# Patient Record
Sex: Female | Born: 1965
Health system: Southern US, Community
[De-identification: ages and names within clinical notes are randomized; demographics above are authoritative.]

## PROBLEM LIST (undated history)

## (undated) DIAGNOSIS — J302 Other seasonal allergic rhinitis: Secondary | ICD-10-CM

## (undated) DIAGNOSIS — T7840XA Allergy, unspecified, initial encounter: Secondary | ICD-10-CM

## (undated) DIAGNOSIS — E785 Hyperlipidemia, unspecified: Secondary | ICD-10-CM

## (undated) DIAGNOSIS — R000 Tachycardia, unspecified: Secondary | ICD-10-CM

## (undated) DIAGNOSIS — M1990 Unspecified osteoarthritis, unspecified site: Secondary | ICD-10-CM

## (undated) DIAGNOSIS — F419 Anxiety disorder, unspecified: Secondary | ICD-10-CM

## (undated) HISTORY — DX: Hyperlipidemia, unspecified: E78.5

## (undated) HISTORY — DX: Other seasonal allergic rhinitis: J30.2

## (undated) HISTORY — DX: Tachycardia, unspecified: R00.0

## (undated) HISTORY — DX: Allergy, unspecified, initial encounter: T78.40XA

## (undated) HISTORY — DX: Anxiety disorder, unspecified: F41.9

## (undated) HISTORY — DX: Unspecified osteoarthritis, unspecified site: M19.90

## (undated) HISTORY — PX: SHOULDER SURGERY: SHX246

---

## 2000-03-06 ENCOUNTER — Other Ambulatory Visit: Admission: RE | Admit: 2000-03-06 | Discharge: 2000-03-06 | Payer: Self-pay

## 2000-03-06 ENCOUNTER — Other Ambulatory Visit: Admission: RE | Admit: 2000-03-06 | Discharge: 2000-03-06 | Payer: Self-pay | Admitting: Obstetrics and Gynecology

## 2000-11-03 ENCOUNTER — Ambulatory Visit (HOSPITAL_COMMUNITY): Admission: RE | Admit: 2000-11-03 | Discharge: 2000-11-03 | Payer: Self-pay | Admitting: Obstetrics and Gynecology

## 2000-11-03 ENCOUNTER — Encounter (INDEPENDENT_AMBULATORY_CARE_PROVIDER_SITE_OTHER): Payer: Self-pay

## 2001-08-10 ENCOUNTER — Encounter (INDEPENDENT_AMBULATORY_CARE_PROVIDER_SITE_OTHER): Payer: Self-pay

## 2001-08-10 ENCOUNTER — Ambulatory Visit (HOSPITAL_COMMUNITY): Admission: RE | Admit: 2001-08-10 | Discharge: 2001-08-10 | Payer: Self-pay

## 2002-02-28 ENCOUNTER — Other Ambulatory Visit: Admission: RE | Admit: 2002-02-28 | Discharge: 2002-02-28 | Payer: Self-pay | Admitting: Obstetrics and Gynecology

## 2003-10-09 ENCOUNTER — Emergency Department (HOSPITAL_COMMUNITY): Admission: EM | Admit: 2003-10-09 | Discharge: 2003-10-09 | Payer: Self-pay | Admitting: Emergency Medicine

## 2007-12-27 ENCOUNTER — Ambulatory Visit: Payer: Self-pay | Admitting: Gastroenterology

## 2007-12-27 DIAGNOSIS — K921 Melena: Secondary | ICD-10-CM | POA: Insufficient documentation

## 2007-12-27 DIAGNOSIS — K589 Irritable bowel syndrome without diarrhea: Secondary | ICD-10-CM | POA: Insufficient documentation

## 2007-12-27 DIAGNOSIS — R109 Unspecified abdominal pain: Secondary | ICD-10-CM | POA: Insufficient documentation

## 2007-12-28 LAB — CONVERTED CEMR LAB
ALT: 12 units/L (ref 0–35)
AST: 17 units/L (ref 0–37)
Alkaline Phosphatase: 53 units/L (ref 39–117)
CO2: 28 meq/L (ref 19–32)
Calcium: 9 mg/dL (ref 8.4–10.5)
Glucose, Bld: 95 mg/dL (ref 70–99)
Lymphocytes Relative: 33.1 % (ref 12.0–46.0)
Monocytes Relative: 6.1 % (ref 3.0–12.0)
Neutrophils Relative %: 58.6 % (ref 43.0–77.0)
Platelets: 283 10*3/uL (ref 150–400)
Potassium: 4.2 meq/L (ref 3.5–5.1)
RDW: 11.6 % (ref 11.5–14.6)
Sed Rate: 9 mm/hr (ref 0–22)
Sodium: 139 meq/L (ref 135–145)
TSH: 2.1 microintl units/mL (ref 0.35–5.50)

## 2008-01-16 ENCOUNTER — Ambulatory Visit: Payer: Self-pay | Admitting: Gastroenterology

## 2008-01-16 ENCOUNTER — Encounter: Payer: Self-pay | Admitting: Gastroenterology

## 2008-01-20 ENCOUNTER — Encounter: Payer: Self-pay | Admitting: Gastroenterology

## 2008-05-19 ENCOUNTER — Ambulatory Visit: Payer: Self-pay | Admitting: Gastroenterology

## 2010-03-07 ENCOUNTER — Encounter: Payer: Self-pay | Admitting: General Practice

## 2010-07-02 NOTE — Op Note (Signed)
[facility]  Patient:    Cynthia Barron, Cynthia Barron [facility] Visit Number: [id number] MRN: [id number]          Service Type: DSU Location: [facility] Attending Physician:  Esmeralda Arthur Dictated by:   Silverio Lay, M.D. Proc. Date: 08/10/01 Admit Date:  08/10/2001 Discharge Date: 08/10/2001                             Operative Report  PREOPERATIVE DIAGNOSES:       Incomplete miscarriage.  POSTOPERATIVE DIAGNOSES:      Incomplete miscarriage.  ANESTHESIA:                   IV sedation and paracervical block.  PROCEDURE:                    Dilatation and evacuation.  SURGEON:                      Silverio Lay, M.D.  ESTIMATED BLOOD LOSS:         Minimal.  PROCEDURE:                    After being informed of the planned procedure with possible complications including bleeding, infection, uterine perforation, and retained products of conception, informed consent is obtained.  The patient is taken to OR #2 and given IV sedation, placed in the lithotomy position.  She was prepped and draped in a sterile fashion and her bladder was emptied with an in-and-out catheter.  GYN examination reveals an anteverted, anteflexed, slightly bulky uterus 6-7 weeks size with a flared cervix and two normal adnexa.  We insert a speculum, infiltrated anterior lip of the cervix with 1 cc of Nesacaine 1%, grasped the anterior lip of the cervix with tenaculum forceps, and sounded the uterus at 10 cm.  We then proceeded with paracervical block in the usual fashion using 10 cc of Nesacaine 1% and the cervix usually allowed a 37 gauge Hegar dilator.  We were then able to use a #10 curved cannula to evacuate what seems to be decidual remnants.  Very few, if any, products of conception.  After evacuation a sharp curette was used to assess the uterine walls which were free of any tissue. Instruments were removed.  Instruments and sponge count was complete x2. Estimated blood loss is  minimal.  Procedure is well tolerated by the patient who is taken to the recovery room in a well and stable condition. Dictated by:   Silverio Lay, M.D. Attending Physician:  Esmeralda Arthur DD:  08/10/01 TD:  08/12/01 Job: [id number]

## 2010-07-02 NOTE — Op Note (Signed)
[facility]  Patient:    Cynthia Barron, Cynthia Barron Visit Number: [id number] MRN: [id number]          Service Type: DSU Location: [facility] Attending Physician:  Leonard Schwartz Dictated by:   Janine Limbo, M.D. Proc. Date: 11/03/00 Admit Date:  11/03/2000                             Operative Report  PREOPERATIVE DIAGNOSES:       1. Pelvic pain.                               2. Ovarian cyst.  POSTOPERATIVE DIAGNOSES:      1. Pelvic pain.                               2. Ovarian cyst.                               3. Pelvic adhesions.                               4. A 1 cm fibroid uterus.  OPERATION:                    1. Diagnostic laparoscopy.                               2. Laparoscopic lysis of adhesions.                               3. Laparoscopic pelvic biopsies.                               4. Chromopertubation.  SURGEON:                      Janine Limbo, M.D.  ASSISTANT:  ANESTHESIA:                   General anesthesia.  ESTIMATED BLOOD LOSS:         20 cc.  INDICATIONS:                  Cynthia Barron is a 45 year old female, gravida 0, who presents with a history of pelvic pain.  An ultrasound was performed that showed a complex cyst on the ovary.  The patient understands the indications for her procedure and she accepts the risks of, but not limited to, anesthetic complications, bleeding, infections, and possible damage to surrounding organs.  FINDINGS:                     The uterus was normal size.  There was a 1 cm fibroid present on the left anterior fundus. The right fallopian tube appeared normal. The right ovary appeared normal except that there were filmy adhesions between the right fallopian tube and the right ovary and the right posterior cul-de-sac. The left fallopian tube was entangled in filmy adhesions.  The tube was adhered to the left ovary. The left ovary was  then adhered to the left pelvic  sidewall.  There were filmy adhesions in the posterior cul-de-sac.  The anterior cul-de-sac was free.  The large-bowel was adhered to the left pelvic sidewall. There were adhesions between the large-bowel and the right pelvic sidewall.  The appendix was adhered to the right ovary and to loops f large bowel.  The liver appeared normal except for perihepatic adhesions.  The left upper abdomen appeared normal.  There was no distinct evidence of endometriosis.  We did obtain biopsies from some of the adhesions to rule out the possibility of endometriosis.  The right fallopian tube quickly filled with dye with chromopertubation.  The dye quickly spilled from the right fallopian tube.  Initially, the left fallopian tube seemed to fill with dye but no dye was noted from the left fimbriated end.  We did instill 120 cc of blue dye and the left tube did eventually fill and dye did finally spill from the left fimbriated end. The fimbriated ends of both fallopian tubes appeared delicate at the end of our procedure.  There were a few filmy adhesions on the left ovary and these were removed as well.  DESCRIPTION OF PROCEDURE:     The patient was taken to the operating room where a general anesthetic was given.  The patients abdomen, perineum, and vagina were prepped with multiple layers of Betadine.  Examination under anesthesia was performed.  A Foley catheter was placed in the bladder.  An acorn cannula was placed inside the uterus.  The patient was sterilely draped. The subumbilical area was injected with 5 cc of 0.5% Marcaine.  An incision was made and the Verres needle was inserted without difficulty.  Proper placement was confirmed using the saline drop test.  A pneumoperitoneum was obtained.  The laparoscopic trocar was then substituted for the Verres needle. The laparoscope was inserted. The bowel was inspected and there was no evidence of trocar damage. Under direct visualization, two  suprapubic incisions were made after injecting the skin with 0.5% Marcaine.  Two 5 mm trocars were placed into the lower abdomen.  Pictures were taken of the patients  pelvic anatomy. We then began the process of lysing the adhesions. Using a combination of sharp and blunt dissection, the adhesions were lysed. Care was taken not to damage the bowel or other vital structures in the pelvis.  There was a small cyst present in the left ovary and the material from inside appeared to be purulent.  We were unable to remove the cyst wall from the ovary but we did obtain biopsies from this cavity for further identification.  The left pelvic sidewall was injected with normal saline where the adhesions were present to the left ovary.  A biopsy was obtained from this area.  Hemostasis was then obtained throughout the pelvis using the bipolar cautery.  The pelvis was vigorously irrigated. THe pelvis was again inspected and there was no evidence of damage to the vital structures. The bowel was viewed and there was no evidence of damage to the bowel.  The dye was then injected through the acorn cannula and the right tube quickly filled and dye quickly spilled from the right tube.  The left tube did not fill initially but with further installation of blue dye, the tube did finally open and dye did spill through the left tube. All the fluid was then irrigated and aspirated from the pelvis.  All instruments were removed.  The incisions were closed using deep and superficial  sutures of 4-0 Vicryl.  Sponge, needle and instrument counts were correct on two occasions. The estimated blood loss was 20 cc.  The patient tolerated the procedure well. She was taken to the recovery room in stable condition.  FOLLOW-UP INSTRUCTIONS:       The patient will return to see Dr. Stefano Gaul in two to three weeks for follow-up examination. She will return to work on November 06, 2000.  She will call for questions or concerns.  She was given a copy of the postoperative instruction sheet as prepared by the [facility] for patients who have undergone laparoscopy.  She was  given a prescription for Tylenol No. 3 one to two tablets every four hours as needed for pain.  She was also given doxycycline 100 mg twice a day for 10 days. Dictated by:   Janine Limbo, M.D. Attending Physician:  Leonard Schwartz DD:  11/03/00 TD:  11/03/00 Job: [phone] [id number]

## 2010-07-02 NOTE — H&P (Signed)
[facility]  Patient:    Cynthia Barron, MEINEKE Visit Number: [id number] MRN: [id number]          Service Type: Attending:  Janine Limbo, M.D. Dictated by:   Janine Limbo, M.D. Adm. Date:  11/03/00                           History and Physical  DATE OF BIRTH:                21-Sep-1965  HISTORY OF PRESENT ILLNESS:   [name] is a 45 year old female, gravida 0, who presents for diagnostic laparoscopy because of abdominal pain. The patient has had an ultrasound which showed a complex cyst in the left ovary. A repeat ultrasound again confirmed the presence of the complex left ovarian cyst. The patient had a chlamydia infection in January of 2002. She was treated and her test of cure was negative. The patient has tried to conceive for quite a long time and has not been successful. The patient denies a history of GYN surgery.  PAST MEDICAL HISTORY:         The patient denies hypertension and diabetes.  ALLERGIES:                    PENICILLIN.  SOCIAL HISTORY:               The patient denies cigarette use, alcohol use, and recreational drug use.  FAMILY HISTORY:               The patients great aunt has diabetes and hypertension.  REVIEW OF SYSTEMS:            The patient complains of occasional constipation, diarrhea and some rectal bleeding. She also has dyspareunia.  PHYSICAL EXAMINATION:  VITAL SIGNS:                  Weight is 135 pounds.  HEENT:                        Within normal limits.  CHEST:                        Clear.  HEART:                        Regular rate and rhythm.  BREASTS:                      Without masses.  BACK:                         Without CVA tenderness.  ABDOMEN:                      Tender in the left lower quadrant but there is no rebound.  EXTREMITIES:                  Within normal limits.  NEUROLOGIC:                   Grossly normal.  PELVIC:                       External  genitalia is normal. The vagina is normal. Cervix is nontender. Uterus is normal size, shape, and consistency.  Adnexa no masses.  ASSESSMENT:                   1. Pelvic pain.                               2. Complex left ovarian cyst.  PLAN:                         The patient will undergo a diagnostic laparoscopy. She understands the indications for her procedure and she accepts the risks of, but not limited to, anesthetic complications, bleeding, infections, and possible damage to the surrounding organs. Dictated by:   Janine Limbo, M.D. Attending:  Janine Limbo, M.D. DD:  10/31/00 TD:  10/31/00 Job: [phone] [id number]

## 2012-11-16 ENCOUNTER — Encounter: Payer: Self-pay | Admitting: Gastroenterology

## 2013-07-13 ENCOUNTER — Encounter: Payer: Self-pay | Admitting: Gastroenterology

## 2014-01-24 ENCOUNTER — Other Ambulatory Visit: Payer: Self-pay | Admitting: Obstetrics and Gynecology

## 2014-01-27 LAB — CYTOLOGY - PAP

## 2015-03-09 DIAGNOSIS — Z3040 Encounter for surveillance of contraceptives, unspecified: Secondary | ICD-10-CM | POA: Diagnosis not present

## 2015-03-09 DIAGNOSIS — Z01411 Encounter for gynecological examination (general) (routine) with abnormal findings: Secondary | ICD-10-CM | POA: Diagnosis not present

## 2015-03-09 DIAGNOSIS — Z01419 Encounter for gynecological examination (general) (routine) without abnormal findings: Secondary | ICD-10-CM | POA: Diagnosis not present

## 2015-03-09 DIAGNOSIS — Z1231 Encounter for screening mammogram for malignant neoplasm of breast: Secondary | ICD-10-CM | POA: Diagnosis not present

## 2015-03-09 DIAGNOSIS — D259 Leiomyoma of uterus, unspecified: Secondary | ICD-10-CM | POA: Diagnosis not present

## 2015-03-09 MED FILL — LARIN FE 1-20 TABLET: 1-20 | 84 days supply | Qty: 84 | Fill #0

## 2015-03-09 MED FILL — SERTRALINE HCL 50 MG TABLET: 50 | 30 days supply | Qty: 30 | Fill #1

## 2015-03-09 MED FILL — ZORVOLEX 35 MG CAPSULE: 35 | 30 days supply | Qty: 30 | Fill #1

## 2015-04-07 MED FILL — SERTRALINE HCL 50 MG TABLET: 50 | 30 days supply | Qty: 30 | Fill #2

## 2015-04-07 MED FILL — METOPROLOL SUCC ER 25 MG TA: 25 | 30 days supply | Qty: 30 | Fill #1

## 2015-04-08 MED FILL — MAGNESIUM OXIDE 400 MG TAB: 400 | 30 days supply | Qty: 30 | Fill #0

## 2015-05-08 MED FILL — SERTRALINE HCL 50 MG TABLET: 50 | 30 days supply | Qty: 30 | Fill #3

## 2015-05-18 MED FILL — METOPROLOL SUCC ER 25 MG TA: 25 | 30 days supply | Qty: 30 | Fill #2

## 2015-05-27 DIAGNOSIS — L918 Other hypertrophic disorders of the skin: Secondary | ICD-10-CM | POA: Diagnosis not present

## 2015-06-10 MED FILL — MAGNESIUM OXIDE 400 MG TAB: 400 | 30 days supply | Qty: 30 | Fill #1

## 2015-06-10 MED FILL — SERTRALINE HCL 50 MG TABLET: 50 | 30 days supply | Qty: 30 | Fill #4

## 2015-06-10 MED FILL — LARIN FE 1-20 TABLET: 1-20 | 84 days supply | Qty: 84 | Fill #1

## 2015-06-19 MED FILL — METOPROLOL SUCC ER 25 MG TA: 25 | 30 days supply | Qty: 30 | Fill #3

## 2015-07-14 MED FILL — SERTRALINE HCL 50 MG TABLET: 50 | 30 days supply | Qty: 30 | Fill #5

## 2015-07-21 MED FILL — METOPROLOL SUCC ER 25 MG TA: 25 | 30 days supply | Qty: 30 | Fill #4

## 2015-07-22 DIAGNOSIS — R002 Palpitations: Secondary | ICD-10-CM | POA: Diagnosis not present

## 2015-07-22 DIAGNOSIS — F419 Anxiety disorder, unspecified: Secondary | ICD-10-CM | POA: Diagnosis not present

## 2015-07-22 DIAGNOSIS — M62830 Muscle spasm of back: Secondary | ICD-10-CM | POA: Diagnosis not present

## 2015-07-24 MED FILL — AMRIX 15 MG CAPSULE ER: 15 | 30 days supply | Qty: 30 | Fill #0

## 2015-08-17 MED FILL — SERTRALINE HCL 50 MG TABLET: 50 | 30 days supply | Qty: 30 | Fill #0

## 2015-08-17 MED FILL — MAGNESIUM OXIDE 400 MG TAB: 400 | 30 days supply | Qty: 30 | Fill #2

## 2015-08-17 MED FILL — METOPROLOL SUCC ER 25 MG TA: 25 | 30 days supply | Qty: 30 | Fill #5

## 2015-08-31 MED FILL — NORETHIN-ESTRAD-FERR 1-0.02: 1-20 | 84 days supply | Qty: 84 | Fill #2

## 2015-09-18 MED FILL — METOPROLOL SUCC ER 25 MG TA: 25 | 90 days supply | Qty: 90 | Fill #0

## 2015-09-18 MED FILL — SERTRALINE HCL 50 MG TABLET: 50 | 30 days supply | Qty: 30 | Fill #1

## 2015-09-25 MED FILL — MAGNESIUM OXIDE 400 MG TAB: 400 | 30 days supply | Qty: 30 | Fill #3

## 2015-10-22 MED FILL — SERTRALINE HCL 50 MG TABLET: 50 | 30 days supply | Qty: 30 | Fill #2

## 2015-11-20 MED FILL — SERTRALINE HCL 50 MG TABLET: 50 | 30 days supply | Qty: 30 | Fill #3

## 2015-11-20 MED FILL — MAGNESIUM OXIDE 400 MG TAB: 400 | 30 days supply | Qty: 30 | Fill #4

## 2015-11-24 MED FILL — NORETHIN-ESTRAD-FERR 1-0.02: 1-20 | 84 days supply | Qty: 84 | Fill #3

## 2015-12-23 MED FILL — SERTRALINE HCL 50 MG TABLET: 50 | 30 days supply | Qty: 30 | Fill #4

## 2015-12-23 MED FILL — METOPROLOL SUCC ER 25 MG TA: 25 | 90 days supply | Qty: 90 | Fill #1

## 2016-01-04 MED FILL — MAGNESIUM OXIDE 400 MG TAB: 400 | 30 days supply | Qty: 30 | Fill #5

## 2016-01-28 MED FILL — SERTRALINE HCL 50 MG TABLET: 50 | 30 days supply | Qty: 30 | Fill #5

## 2016-02-16 MED FILL — NORETHIN-ESTRAD-FERR 1-0.02: 1-20 | 28 days supply | Qty: 28 | Fill #0

## 2016-03-07 DIAGNOSIS — M545 Low back pain: Secondary | ICD-10-CM | POA: Diagnosis not present

## 2016-03-07 DIAGNOSIS — Z0000 Encounter for general adult medical examination without abnormal findings: Secondary | ICD-10-CM | POA: Diagnosis not present

## 2016-03-07 MED FILL — CELECOXIB 200 MG CAPSULE: 200 | 30 days supply | Qty: 30 | Fill #0

## 2016-03-07 MED FILL — SERTRALINE HCL 50 MG TABLET: 50 | 90 days supply | Qty: 90 | Fill #0

## 2016-03-07 MED FILL — CYCLOBENZAPRINE 10 MG TAB: 10 | 30 days supply | Qty: 30 | Fill #0

## 2016-03-07 MED FILL — METOPROLOL SUCC ER 25 MG TA: 25 | 90 days supply | Qty: 90 | Fill #0

## 2016-03-16 MED FILL — NORETHIN-ESTRAD-FERR 1-0.02: 1-20 | 28 days supply | Qty: 28 | Fill #1

## 2016-04-12 MED FILL — NORETHIN-ESTRAD-FERR 1-0.02: 1-20 | 28 days supply | Qty: 28 | Fill #0

## 2016-05-05 DIAGNOSIS — Z01419 Encounter for gynecological examination (general) (routine) without abnormal findings: Secondary | ICD-10-CM | POA: Diagnosis not present

## 2016-05-05 DIAGNOSIS — Z6828 Body mass index (BMI) 28.0-28.9, adult: Secondary | ICD-10-CM | POA: Diagnosis not present

## 2016-05-05 DIAGNOSIS — Z1231 Encounter for screening mammogram for malignant neoplasm of breast: Secondary | ICD-10-CM | POA: Diagnosis not present

## 2016-05-05 LAB — HM PAP SMEAR

## 2016-05-05 MED FILL — NORETHIN-ESTRAD-FERR 1-0.02: 1-20 | 84 days supply | Qty: 84 | Fill #0

## 2016-05-09 MED FILL — CELECOXIB 200 MG CAPSULE: 200 | 30 days supply | Qty: 30 | Fill #1

## 2016-06-07 MED FILL — SERTRALINE HCL 50 MG TABLET: 50 | 90 days supply | Qty: 90 | Fill #1

## 2016-06-23 MED FILL — METOPROLOL SUCC ER 25 MG TA: 25 | 90 days supply | Qty: 90 | Fill #1

## 2016-08-04 MED FILL — FLUCONAZOLE 150 MG TABLET: 150 | 1 days supply | Qty: 1 | Fill #0

## 2016-08-04 MED FILL — NORETHIN-ESTRAD-FERR 1-0.02: 1-20 | 84 days supply | Qty: 84 | Fill #1

## 2016-08-22 DIAGNOSIS — K581 Irritable bowel syndrome with constipation: Secondary | ICD-10-CM | POA: Diagnosis not present

## 2016-08-22 DIAGNOSIS — M549 Dorsalgia, unspecified: Secondary | ICD-10-CM | POA: Diagnosis not present

## 2016-08-22 DIAGNOSIS — R002 Palpitations: Secondary | ICD-10-CM | POA: Diagnosis not present

## 2016-08-22 DIAGNOSIS — Z86010 Personal history of colonic polyps: Secondary | ICD-10-CM | POA: Diagnosis not present

## 2016-08-22 MED FILL — CYCLOBENZAPRINE 10 MG TAB: 10 | 30 days supply | Qty: 30 | Fill #0

## 2016-08-22 MED FILL — CELECOXIB 200 MG CAP: 200 | 30 days supply | Qty: 30 | Fill #0

## 2016-08-22 MED FILL — MAGNESIUM OXIDE 400 MG TAB: 400 | 90 days supply | Qty: 90 | Fill #0

## 2016-08-29 DIAGNOSIS — J029 Acute pharyngitis, unspecified: Secondary | ICD-10-CM | POA: Diagnosis not present

## 2016-09-20 MED FILL — SERTRALINE HCL 50 MG TABLET: 50 | 90 days supply | Qty: 90 | Fill #2

## 2016-10-04 MED FILL — METOPROLOL SUCC ER 25 MG TA: 25 | 90 days supply | Qty: 90 | Fill #2

## 2016-10-27 MED FILL — NORETHIN-ESTRAD-FERR 1-0.02: 1-20 | 84 days supply | Qty: 84 | Fill #2

## 2016-12-05 MED FILL — CELECOXIB 200 MG CAPS: 200 | 30 days supply | Qty: 30 | Fill #1

## 2016-12-20 MED FILL — SERTRALINE HCL 50 MG TABLET: 50 | 90 days supply | Qty: 90 | Fill #0

## 2016-12-20 MED FILL — METOPROLOL SUCC ER 25 MG TA: 25 | 90 days supply | Qty: 90 | Fill #0

## 2017-01-20 MED FILL — LARIN FE 1-20 TABLET: 1-20 | 84 days supply | Qty: 84 | Fill #3

## 2017-03-14 MED FILL — MAGNESIUM OXIDE 400 MG TABS: 400 | 90 days supply | Qty: 90 | Fill #1

## 2017-04-12 MED FILL — LARIN FE 1-20 TABLET: 1-20 | 84 days supply | Qty: 84 | Fill #0

## 2017-04-12 MED FILL — SERTRALINE HCL 50 MG TABLET: 50 | 90 days supply | Qty: 90 | Fill #1

## 2017-04-12 MED FILL — METOPROLOL SUCCINATE ER 25: 25 | 90 days supply | Qty: 90 | Fill #1

## 2017-04-17 MED FILL — VIT D2 1.25 MG (50,000 UNIT: 1.25 MG | 28 days supply | Qty: 8 | Fill #0

## 2017-05-01 MED FILL — CELECOXIB 200 MG CAP: 200 | 30 days supply | Qty: 30 | Fill #2

## 2017-05-09 MED FILL — TERCONAZOLE 0.4% VAG CREAM: 0.4 | 5 days supply | Qty: 45 | Fill #0

## 2017-05-09 MED FILL — ASHLYNA 0.15-0.03-0.01 MG T: 0.15-0.03 & | 91 days supply | Qty: 91 | Fill #0

## 2017-05-15 MED FILL — VIT D2 1.25 MG (50,000 UNIT: 1.25 MG | 28 days supply | Qty: 8 | Fill #1

## 2017-05-24 MED FILL — FLUTICASONE PROP 50 MCG SPR: 50 | 60 days supply | Qty: 16 | Fill #0

## 2017-05-26 MED FILL — LEVOCETIRIZINE 5 MG TABLET: 5 | 90 days supply | Qty: 90 | Fill #0

## 2017-06-16 MED FILL — SERTRALINE HCL 100 MG TAB: 100 | 90 days supply | Qty: 90 | Fill #0

## 2017-06-16 MED FILL — VIT D2 1.25 MG (50,000 UNIT: 1.25 MG | 28 days supply | Qty: 8 | Fill #2

## 2017-08-10 MED FILL — DAYSEE 0.15-0.03-0.01 MG TA: 0.15-0.03 & | 91 days supply | Qty: 91 | Fill #1

## 2017-08-14 MED FILL — IBUPROFEN 800 MG TAB: 800 | 20 days supply | Qty: 60 | Fill #0

## 2017-08-15 MED FILL — METOPROLOL SUCCINATE ER 25: 25 | 90 days supply | Qty: 90 | Fill #2

## 2017-08-15 MED FILL — MAGNESIUM OXIDE 400 MG TABS: 400 | 90 days supply | Qty: 90 | Fill #0

## 2017-08-21 MED FILL — CYCLOBENZAPRINE 10 MG TAB: 10 | 30 days supply | Qty: 30 | Fill #1

## 2017-09-13 ENCOUNTER — Telehealth (HOSPITAL_COMMUNITY): Payer: Self-pay | Admitting: Emergency Medicine

## 2017-09-13 ENCOUNTER — Ambulatory Visit (HOSPITAL_COMMUNITY)
Admission: EM | Admit: 2017-09-13 | Discharge: 2017-09-13 | Disposition: A | Discharge status: Home / Self Care | Payer: No Typology Code available for payment source | Source: Home / Self Care | Attending: Internal Medicine | Admitting: Internal Medicine

## 2017-09-13 ENCOUNTER — Other Ambulatory Visit: Payer: Self-pay

## 2017-09-13 ENCOUNTER — Encounter (HOSPITAL_COMMUNITY): Payer: Self-pay | Admitting: *Deleted

## 2017-09-13 ENCOUNTER — Emergency Department (HOSPITAL_BASED_OUTPATIENT_CLINIC_OR_DEPARTMENT_OTHER)
Admit: 2017-09-13 | Discharge: 2017-09-13 | Disposition: A | Discharge status: Home / Self Care | Payer: No Typology Code available for payment source | Attending: Emergency Medicine | Admitting: Emergency Medicine

## 2017-09-13 ENCOUNTER — Encounter (HOSPITAL_COMMUNITY): Payer: Self-pay

## 2017-09-13 ENCOUNTER — Emergency Department (HOSPITAL_COMMUNITY)
Admission: EM | Admit: 2017-09-13 | Discharge: 2017-09-13 | Disposition: A | Discharge status: Home / Self Care | Payer: No Typology Code available for payment source | Attending: Emergency Medicine | Admitting: Emergency Medicine

## 2017-09-13 DIAGNOSIS — Z881 Allergy status to other antibiotic agents status: Secondary | ICD-10-CM | POA: Insufficient documentation

## 2017-09-13 DIAGNOSIS — M7989 Other specified soft tissue disorders: Secondary | ICD-10-CM | POA: Insufficient documentation

## 2017-09-13 DIAGNOSIS — Z880 Allergy status to penicillin: Secondary | ICD-10-CM | POA: Insufficient documentation

## 2017-09-13 DIAGNOSIS — M79605 Pain in left leg: Secondary | ICD-10-CM

## 2017-09-13 DIAGNOSIS — M79662 Pain in left lower leg: Secondary | ICD-10-CM | POA: Insufficient documentation

## 2017-09-13 DIAGNOSIS — M79652 Pain in left thigh: Secondary | ICD-10-CM

## 2017-09-13 DIAGNOSIS — R2242 Localized swelling, mass and lump, left lower limb: Secondary | ICD-10-CM | POA: Insufficient documentation

## 2017-09-13 DIAGNOSIS — Z8249 Family history of ischemic heart disease and other diseases of the circulatory system: Secondary | ICD-10-CM | POA: Insufficient documentation

## 2017-09-13 LAB — D-DIMER, QUANTITATIVE: D-Dimer, Quant: 1.05 ug/mL-FEU — ABNORMAL HIGH (ref 0.00–0.50)

## 2017-09-13 MED ORDER — CEPHALEXIN 500 MG PO CAPS: 500.0000 mg | ORAL_CAPSULE | Freq: Four times a day (QID) | ORAL | 0 refills | Status: DC

## 2017-09-13 NOTE — Discharge Instructions (Signed)
Your DVT study was negative today.  I think this is likely phlebitis versus cellulitis.  Take short course of Keflex as directed, Tylenol and ibuprofen as needed for pain.  Elevate the leg as much as possible and use knee sleeve for support, try and stay off the knee with crutches.  Use warm compresses over the area of redness if not improving please follow-up with your primary care doctor if redness begins to spread and worsen, you have significantly worsening pain or fevers please return to the emergency department.

## 2017-09-13 NOTE — Progress Notes (Signed)
Left lower extremity venous duplex has been completed. Negative for DVT. Results were given to Benedetto Goad PA.  09/13/17 6:48 PM Carlos Levering RVT

## 2017-09-13 NOTE — ED Notes (Signed)
Appointment made for DVT study tomorrow at 11am at [facility]. Pt made aware if D dimer is neg to keep appt.

## 2017-09-13 NOTE — ED Triage Notes (Signed)
Pt presents with left leg pain and swelling

## 2017-09-13 NOTE — ED Notes (Signed)
Pt stated she didn't want the crutches.RN notified.

## 2017-09-13 NOTE — Discharge Instructions (Addendum)
Please go to the ER to have your ultrasound done tonight.  Your blood work was abnormal and I am concerned about a DVT.

## 2017-09-13 NOTE — ED Triage Notes (Signed)
Pt sent from [facility]  For [place] for Blood clot in Lt leg.

## 2017-09-13 NOTE — ED Provider Notes (Signed)
[facility]    CSN: [id number] Arrival date & time: 09/13/17  1536     History   Chief Complaint Chief Complaint  Patient presents with  . Leg Pain    left leg    HPI Cynthia Barron is a 52 y.o. female.   Patient is a healthy 52 year old female that presents with 2 days of left upper leg swelling, erythema, pain.  The swelling is in the posterior knee with erythema and swelling extending into upper thigh/groin.  The problem has been constant and worsening.  She does not have a history of DVT, PE but her mother had a DVT and another family member died of a PE.  She denies any fever, chills, body aches, rashes, insect bites.  She denies any chest pain, shortness of breath.  She denies any recent traveling and she does not smoke.   ROS per HPI      History reviewed. No pertinent past medical history.  Patient Active Problem List   Diagnosis Date Noted  . IBS 12/27/2007  . Blood in stool 12/27/2007  . ABDOMINAL PAIN, LOWER 12/27/2007    History reviewed. No pertinent surgical history.  OB History   None      Home Medications    Prior to Admission medications   Not on File    Family History History reviewed. No pertinent family history.  Social History Social History   Tobacco Use  . Smoking status: Not on file  Substance Use Topics  . Alcohol use: Not on file  . Drug use: Not on file     Allergies   Dicyclomine hcl and Penicillins   Review of Systems Review of Systems   Physical Exam Triage Vital Signs ED Triage Vitals  Enc Vitals Group     BP 09/13/17 1547 136/83     Pulse Rate 09/13/17 1547 100     Resp 09/13/17 1547 18     Temp 09/13/17 1547 98.1 F (36.7 C)     Temp Source 09/13/17 1547 Oral     SpO2 09/13/17 1547 98 %     Weight --      Height --      Head Circumference --      Peak Flow --      Pain Score 09/13/17 1548 8     Pain Loc --      Pain Edu? --      Excl. in [place]? --    No data found.  Updated Vital  Signs BP 136/83 (BP Location: Right Arm)   Pulse 100   Temp 98.1 F (36.7 C) (Oral)   Resp 18   LMP 07/19/2017   SpO2 98%   Visual Acuity Right Eye Distance:   Left Eye Distance:   Bilateral Distance:    Right Eye Near:   Left Eye Near:    Bilateral Near:     Physical Exam  Constitutional: She is oriented to person, place, and time. She appears well-developed and well-nourished.  Neck: Normal range of motion.  Cardiovascular: Normal rate and regular rhythm.  Pulmonary/Chest: Effort normal and breath sounds normal.  Neurological: She is alert and oriented to person, place, and time.  Swelling, erythema, increased warmth starting at left medial posterior knee, and extending into left upper thigh.  Tender to palpation.   Skin: Skin is warm and dry. Capillary refill takes less than 2 seconds.  Psychiatric: She has a normal mood and affect.  Nursing note and vitals reviewed.  UC Treatments / Results  Labs (all labs ordered are listed, but only abnormal results are displayed) Labs Reviewed - No data to display  EKG None  Radiology No results found.  Procedures Procedures (including critical care time)  Medications Ordered in UC Medications - No data to display  Initial Impression / Assessment and Plan / UC Course  I have reviewed the triage vital signs and the nursing notes.  Pertinent labs & imaging results that were available during my care of the patient were reviewed by me and considered in my medical decision making (see chart for details).    Possibility of DVT based on physical exam and family history.  Could be superficial phlebitis but we need to rule out DVT.  Unlikely cellulitis.  spoke with vascular lab and they are able to see her at 11:00 in the morning for a ultrasound.  Will draw a d-dimer in the meantime.  If the d-dimer is elevated strong possibility patient may be sent to the ER for an ultrasound.    D-dimer elevated.  We will send patient to  the ER for DVT rule out.  Final Clinical Impressions(s) / UC Diagnoses   Final diagnoses:  None   Discharge Instructions   None    ED Prescriptions    None     Controlled Substance Prescriptions  Controlled Substance Registry consulted? Not Applicable   Orvan July, NP 09/13/17 [phone]

## 2017-09-13 NOTE — ED Triage Notes (Signed)
US Tech at bedside.

## 2017-09-13 NOTE — ED Provider Notes (Signed)
[place] EMERGENCY DEPARTMENT Provider Note   CSN: [id number] Arrival date & time: 09/13/17  1802     History   Chief Complaint Chief Complaint  Patient presents with  . Leg Pain    HPI Cynthia Barron is a 52 y.o. female.  Cynthia Barron is a 52 y.o. Female with a history of IBS, who presents from urgent care for evaluation of left leg pain, redness and swelling.  Patient reports redness over the back of the knee and upper calf started 2 days ago, and seemed to spread up to the lower thigh today, she reports swelling over the back of the knee, she is able to fully bend and straighten the knee and bear weight on it but it is somewhat painful.  Problem is constant and worsening.  Story of DVT or PE, but mother did have history of DVT.  She is not on any blood thinners, and never has been in the past.  No OCPs, no recent travel or surgeries.  She denies fevers or chills, body aches, rashes or insect bites.  She denies any chest pain, chest tightness, shortness of breath or pain with deep breath, no palpitations.  Patient is non-smoker.     History reviewed. No pertinent past medical history.  Patient Active Problem List   Diagnosis Date Noted  . IBS 12/27/2007  . Blood in stool 12/27/2007  . ABDOMINAL PAIN, LOWER 12/27/2007    History reviewed. No pertinent surgical history.   OB History   None      Home Medications    Prior to Admission medications   Medication Sig Start Date End Date Taking? Authorizing Provider  cephALEXin (KEFLEX) 500 MG capsule Take 1 capsule (500 mg total) by mouth 4 (four) times daily. 09/13/17   Jacqlyn Larsen, PA-C    Family History History reviewed. No pertinent family history.  Social History Social History   Tobacco Use  . Smoking status: Never Smoker  . Smokeless tobacco: Never Used  Substance Use Topics  . Alcohol use: Not Currently  . Drug use: Never     Allergies   Dicyclomine hcl and  Penicillins   Review of Systems Review of Systems  Constitutional: Negative for chills and fever.  Respiratory: Negative for cough, chest tightness, shortness of breath and wheezing.   Cardiovascular: Positive for leg swelling. Negative for chest pain and palpitations.  Gastrointestinal: Negative for abdominal pain, nausea and vomiting.  Musculoskeletal: Positive for arthralgias, joint swelling and myalgias.  Skin: Positive for color change. Negative for rash and wound.  Neurological: Negative for weakness and numbness.  All other systems reviewed and are negative.    Physical Exam Updated Vital Signs BP (!) 143/90 (BP Location: Right Arm)   Pulse 95   Temp 98.3 F (36.8 C) (Oral)   Resp 16   Ht 5\' 4"  (1.626 m)   Wt 75.8 kg (167 lb)   SpO2 99%   BMI 28.67 kg/m   Physical Exam  Constitutional: She is oriented to person, place, and time. She appears well-developed and well-nourished. No distress.  HENT:  Head: Normocephalic and atraumatic.  Eyes: Right eye exhibits no discharge. Left eye exhibits no discharge.  Neck: Neck supple.  Cardiovascular: Normal rate, regular rhythm, normal heart sounds and intact distal pulses.  Pulses:      Radial pulses are 2+ on the right side, and 2+ on the left side.       Dorsalis pedis pulses are 2+ on the right  side, and 2+ on the left side.       Posterior tibial pulses are 2+ on the right side, and 2+ on the left side.  Pulmonary/Chest: Effort normal and breath sounds normal. No respiratory distress.  Respirations equal and unlabored, patient able to speak in full sentences, lungs clear to auscultation bilaterally  Abdominal: Soft. Bowel sounds are normal. She exhibits no distension and no mass. There is no tenderness. There is no guarding.  Musculoskeletal:  Erythema extending from the top of the inner left calf behind the knee and up to the lower thigh, and some palpable swelling over the posterior knee, mildly tender to palpation with  some warmth.  No fluctuance noted here aspect of the knee and patient has full flexion and extension of the knee.  2+ DP and TP pulses, sensation intact, 5/5 motor strength  Neurological: She is alert and oriented to person, place, and time. Coordination normal.  Skin: Skin is warm and dry. She is not diaphoretic.  Psychiatric: She has a normal mood and affect. Her behavior is normal.  Nursing note and vitals reviewed.    ED Treatments / Results  Labs (all labs ordered are listed, but only abnormal results are displayed) Labs Reviewed - No data to display  EKG None  Radiology No results found.  Procedures Procedures (including critical care time)  Medications Ordered in ED Medications - No data to display   Initial Impression / Assessment and Plan / ED Course  I have reviewed the triage vital signs and the nursing notes.  Pertinent labs & imaging results that were available during my care of the patient were reviewed by me and considered in my medical decision making (see chart for details).  Patient presents from urgent care for evaluation of left leg, redness, swelling and pain.  Had positive d-dimer here, so sent for DVT study.  Symptoms have been present and worsening over the past 2 to 3 days.  Patient denies any associated fevers or chills, no recent insect bites.  Patient denies any chest pain, shortness of breath, palpitations or chest tightness to suggest PE.  No history of DVT or PE.  Not on OCPs, no recent long distance travel or surgeries, non-smoker.  DVT study is negative.  I think this could be phlebitis versus cellulitis, less likely a musculoskeletal injury.  Will treat with a short course of Keflex, encouraged knee sleeve and crutches to get off of the knee and elevated as much as possible, and warm compresses over the area of erythema.  Strict return precautions are discussed.  Given the patient has no symptoms suggestive of PE, and is not tachycardic, tachypneic  or hypoxic I do not think that a CT angios is necessary at this time, discussed with patient and she is in agreement with this plan.  She will follow-up with her primary care provider.  Final Clinical Impressions(s) / ED Diagnoses   Final diagnoses:  Pain and swelling of left lower leg    ED Discharge Orders        Ordered    cephALEXin (KEFLEX) 500 MG capsule  4 times daily     09/13/17 1910       Janet Berlin 09/13/17 1942    Merrily Pew, MD 09/16/17 [phone]

## 2017-09-14 ENCOUNTER — Encounter (HOSPITAL_COMMUNITY): Payer: No Typology Code available for payment source

## 2017-09-14 MED FILL — CEPHALEXIN 500 MG CAPSULE: 500 | 5 days supply | Qty: 20 | Fill #0

## 2017-10-24 MED FILL — SERTRALINE HCL 100 MG TAB: 100 | 90 days supply | Qty: 90 | Fill #1

## 2017-11-08 MED FILL — MAGNESIUM OXIDE 400 MG TABS: 400 | 90 days supply | Qty: 90 | Fill #1

## 2017-11-08 MED FILL — DAYSEE 0.15-0.03-0.01 MG TA: 0.15-0.03 & | 91 days supply | Qty: 91 | Fill #2

## 2018-01-01 MED FILL — METOPROLOL SUCCINATE ER 25: 25 | 90 days supply | Qty: 90 | Fill #0

## 2018-01-01 MED FILL — IBUPROFEN 800 MG TAB: 800 | 20 days supply | Qty: 60 | Fill #1

## 2018-01-05 MED FILL — LEVOCETIRIZINE 5 MG TABLET: 5 | 90 days supply | Qty: 90 | Fill #1

## 2018-02-08 MED FILL — SERTRALINE HCL 100 MG TAB: 100 | 90 days supply | Qty: 90 | Fill #2

## 2018-04-13 ENCOUNTER — Other Ambulatory Visit: Payer: Self-pay | Admitting: Internal Medicine

## 2018-04-13 MED FILL — METOPROLOL SUCCINATE ER 25: 25 | 90 days supply | Qty: 90 | Fill #0

## 2018-05-09 ENCOUNTER — Other Ambulatory Visit: Payer: Self-pay | Admitting: Internal Medicine

## 2018-05-17 ENCOUNTER — Other Ambulatory Visit: Payer: Self-pay

## 2018-05-17 ENCOUNTER — Ambulatory Visit (INDEPENDENT_AMBULATORY_CARE_PROVIDER_SITE_OTHER): Payer: No Typology Code available for payment source | Admitting: Internal Medicine

## 2018-05-17 ENCOUNTER — Encounter: Payer: Self-pay | Admitting: Internal Medicine

## 2018-05-17 VITALS — BP 110/82 | HR 93 | Temp 98.1°F | Ht 63.6 in | Wt 160.0 lb

## 2018-05-17 DIAGNOSIS — Z0000 Encounter for general adult medical examination without abnormal findings: Secondary | ICD-10-CM

## 2018-05-17 DIAGNOSIS — R002 Palpitations: Secondary | ICD-10-CM | POA: Diagnosis not present

## 2018-05-17 DIAGNOSIS — G8929 Other chronic pain: Secondary | ICD-10-CM | POA: Diagnosis not present

## 2018-05-17 DIAGNOSIS — M5442 Lumbago with sciatica, left side: Secondary | ICD-10-CM | POA: Diagnosis not present

## 2018-05-17 DIAGNOSIS — R42 Dizziness and giddiness: Secondary | ICD-10-CM | POA: Diagnosis not present

## 2018-05-17 LAB — POCT URINALYSIS DIPSTICK
Bilirubin, UA: NEGATIVE
Glucose, UA: NEGATIVE
Ketones, UA: NEGATIVE
Leukocytes, UA: NEGATIVE
Nitrite, UA: NEGATIVE
Protein, UA: NEGATIVE
Spec Grav, UA: 1.02 (ref 1.010–1.025)
Urobilinogen, UA: 0.2 E.U./dL
pH, UA: 6 (ref 5.0–8.0)

## 2018-05-17 LAB — CBC
Hematocrit: 34.2 % (ref 34.0–46.6)
Hemoglobin: 11.9 g/dL (ref 11.1–15.9)
MCH: 30 pg (ref 26.6–33.0)
MCHC: 34.8 g/dL (ref 31.5–35.7)
MCV: 86 fL (ref 79–97)
Platelets: 319 10*3/uL (ref 150–450)
RBC: 3.97 x10E6/uL (ref 3.77–5.28)
RDW: 12.6 % (ref 11.7–15.4)
WBC: 4.2 10*3/uL (ref 3.4–10.8)

## 2018-05-17 LAB — HEMOGLOBIN A1C
Est. average glucose Bld gHb Est-mCnc: 120 mg/dL
Hgb A1c MFr Bld: 5.8 % — ABNORMAL HIGH (ref 4.8–5.6)

## 2018-05-17 LAB — CMP14+EGFR
ALT: 33 IU/L — ABNORMAL HIGH (ref 0–32)
AST: 32 IU/L (ref 0–40)
Albumin/Globulin Ratio: 2 (ref 1.2–2.2)
Albumin: 4.3 g/dL (ref 3.8–4.9)
Alkaline Phosphatase: 68 IU/L (ref 39–117)
BUN/Creatinine Ratio: 16 (ref 9–23)
BUN: 15 mg/dL (ref 6–24)
Bilirubin Total: 0.3 mg/dL (ref 0.0–1.2)
CO2: 24 mmol/L (ref 20–29)
Calcium: 9.4 mg/dL (ref 8.7–10.2)
Chloride: 104 mmol/L (ref 96–106)
Creatinine, Ser: 0.92 mg/dL (ref 0.57–1.00)
GFR calc Af Amer: 82 mL/min/{1.73_m2} (ref >59)
GFR calc non Af Amer: 71 mL/min/{1.73_m2} (ref >59)
Globulin, Total: 2.2 g/dL (ref 1.5–4.5)
Glucose: 74 mg/dL (ref 65–99)
Potassium: 4.2 mmol/L (ref 3.5–5.2)
Sodium: 142 mmol/L (ref 134–144)
Total Protein: 6.5 g/dL (ref 6.0–8.5)

## 2018-05-17 LAB — LIPID PANEL
Chol/HDL Ratio: 5.2 ratio — ABNORMAL HIGH (ref 0.0–4.4)
Cholesterol, Total: 206 mg/dL — ABNORMAL HIGH (ref 100–199)
HDL: 40 mg/dL (ref >39)
LDL Calculated: 123 mg/dL — ABNORMAL HIGH (ref 0–99)
Triglycerides: 213 mg/dL — ABNORMAL HIGH (ref 0–149)
VLDL Cholesterol Cal: 43 mg/dL — ABNORMAL HIGH (ref 5–40)

## 2018-05-17 MED ORDER — METOPROLOL SUCCINATE ER 25 MG PO TB24: 25.0000 mg | ORAL_TABLET | Freq: Every day | ORAL | 2 refills | Status: DC

## 2018-05-17 MED ORDER — CYCLOBENZAPRINE HCL 5 MG PO TABS: 5.0000 mg | ORAL_TABLET | Freq: Three times a day (TID) | ORAL | 1 refills | Status: DC | PRN

## 2018-05-17 MED ORDER — SERTRALINE HCL 100 MG PO TABS: 100.0000 mg | ORAL_TABLET | Freq: Every day | ORAL | 2 refills | Status: DC

## 2018-05-17 MED ORDER — LEVOCETIRIZINE DIHYDROCHLORIDE 5 MG PO TABS: 5.0000 mg | ORAL_TABLET | Freq: Every evening | ORAL | 2 refills | Status: DC

## 2018-05-17 NOTE — Progress Notes (Signed)
Subjective:     Patient ID: Cynthia Barron , female    DOB: [birth year] , 53 y.o.   MRN: [id number]   Chief Complaint  Patient presents with  . Annual Exam    HPI  She is here today for a full physical examination.  She is followed by Dr. Matthew Saras for her pelvic exams.  Her last visit was March 2019. She had her mammogram at that time as well.     History reviewed. No pertinent past medical history.   Family History  Problem Relation Age of Onset  . Parkinson's disease Mother   . Diabetes Mother   . Hypertension Mother   . Hyperlipidemia Mother   . Hypertension Father   . Hyperlipidemia Father      Current Outpatient Medications:  .  cyclobenzaprine (FLEXERIL) 5 MG tablet, Take 1 tablet (5 mg total) by mouth 3 (three) times daily as needed for muscle spasms., Disp: 30 tablet, Rfl: 1 .  levocetirizine (XYZAL) 5 MG tablet, Take 1 tablet (5 mg total) by mouth every evening., Disp: 90 tablet, Rfl: 2 .  magnesium oxide (MAG-OX) 400 MG tablet, Take 400 mg by mouth daily., Disp: , Rfl:  .  metoprolol succinate (TOPROL-XL) 25 MG 24 hr tablet, Take 1 tablet (25 mg total) by mouth daily., Disp: 90 tablet, Rfl: 2 .  sertraline (ZOLOFT) 100 MG tablet, Take 1 tablet (100 mg total) by mouth daily., Disp: 90 tablet, Rfl: 2   Allergies  Allergen Reactions  . Dicyclomine Hcl     REACTION: Dizziness  . Penicillins     REACTION: vomiting      No LMP recorded (lmp unknown). (Menstrual status: Oral contraceptives)..  Negative for: breast discharge, breast lump(s), breast pain and breast self exam. Associated symptoms include abnormal vaginal bleeding. Pertinent negatives include abnormal bleeding (hematology), anxiety, decreased libido, depression, difficulty falling sleep, dyspareunia, history of infertility, nocturia, sexual dysfunction, sleep disturbances, urinary incontinence, urinary urgency, vaginal discharge and vaginal itching. Diet regular.The patient states her exercise level is   moderate.  . The patient's tobacco use is:  Social History   Tobacco Use  Smoking Status Never Smoker  Smokeless Tobacco Never Used  . She has been exposed to passive smoke. The patient's alcohol use is:  Social History   Substance and Sexual Activity  Alcohol Use Not Currently  . Additional information: Last pap 05/10/2017, next one scheduled for later this year.   Review of Systems  Constitutional: Negative.   Eyes: Negative.   Respiratory: Negative.   Cardiovascular: Positive for palpitations (has h/o palpitations. takes meds. these are effective in controlling her sx. ).  Gastrointestinal: Negative.   Endocrine: Negative.   Musculoskeletal: Positive for back pain (she reports h/o chronic back pain. has intermittent flares. 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 uses flexeril prn).  Allergic/Immunologic: Negative.   Neurological: Positive for dizziness (she reports vertigo for past week. thinks she has water in her ears. ).  Hematological: Negative.   Psychiatric/Behavioral: Negative.      Today's Vitals   05/17/18 0924  BP: 110/82  Pulse: 93  Temp: 98.1 F (36.7 C)  TempSrc: Oral  Weight: 160 lb (72.6 kg)  Height: 5' 3.6" (1.615 m)   Body mass index is 27.81 kg/m.   Objective:  Physical Exam Vitals signs and nursing note reviewed.  Constitutional:      Appearance: Normal appearance.  HENT:     Head: Normocephalic and atraumatic.     Right Ear: Tympanic  membrane, ear canal and external ear normal.     Left Ear: Tympanic membrane, ear canal and external ear normal.     Nose: Nose normal.     Mouth/Throat:     Mouth: Mucous membranes are moist.     Pharynx: Oropharynx is clear.  Eyes:     Extraocular Movements: Extraocular movements intact.     Conjunctiva/sclera: Conjunctivae normal.     Pupils: Pupils are equal, round, and reactive to light.  Neck:     Musculoskeletal: Normal range of motion and neck supple.  Cardiovascular:     Rate and Rhythm:  Normal rate and regular rhythm.     Pulses: Normal pulses.     Heart sounds: Normal heart sounds.  Pulmonary:     Effort: Pulmonary effort is normal.     Breath sounds: Normal breath sounds.  Chest:     Breasts:        Right: Inverted nipple present. No swelling, bleeding, mass or nipple discharge.        Left: Inverted nipple present. No swelling, bleeding, mass or nipple discharge.  Abdominal:     General: Abdomen is flat. Bowel sounds are normal.     Palpations: Abdomen is soft.  Genitourinary:    Comments: deferred Musculoskeletal: Normal range of motion.  Skin:    General: Skin is warm and dry.  Neurological:     General: No focal deficit present.     Mental Status: She is alert and oriented to person, place, and time.  Psychiatric:        Mood and Affect: Mood normal.        Behavior: Behavior normal.         Assessment And Plan:     1. Routine general medical examination at health care facility  A full exam was performed.  She admits that she is not performing monthly SBE. Importance of monthly SBE was stressed to the patient.  PATIENT HAS BEEN ADVISED TO GET 30-45 MINUTES REGULAR EXERCISE NO LESS THAN FOUR TO FIVE DAYS PER WEEK - BOTH WEIGHTBEARING EXERCISES AND AEROBIC ARE RECOMMENDED.  SHE IS ADVISED TO FOLLOW A HEALTHY DIET WITH AT LEAST SIX FRUITS/VEGGIES PER DAY, DECREASE INTAKE OF RED MEAT, AND TO INCREASE FISH INTAKE TO TWO DAYS PER WEEK.  MEATS/FISH SHOULD NOT BE FRIED, BAKED OR BROILED IS PREFERABLE.  I SUGGEST WEARING SPF 50 SUNSCREEN ON EXPOSED PARTS AND ESPECIALLY WHEN IN THE DIRECT SUNLIGHT FOR AN EXTENDED PERIOD OF TIME.  PLEASE AVOID FAST FOOD RESTAURANTS AND INCREASE YOUR WATER INTAKE. - CMP14+EGFR - CBC - Lipid panel - Hemoglobin A1c  2. Chronic left-sided low back pain with left-sided sciatica  She was given refill of flexeril to use prn. If her symptoms worsen in intensity/frequency, she agrees to [place] for further evaluation.   3.  Vertigo  She was given samples of Norel AD to take twice daily as needed. She is also encouraged to stay well hydrated.   4. Palpitations  Chronic, yet stable. Well controlled with metoprolol. She will continue with current meds. EKG is without new changes.   - EKG 12-Lead  Maximino Greenland, MD    THE PATIENT IS ENCOURAGED TO PRACTICE SOCIAL DISTANCING DUE TO THE COVID-19 PANDEMIC.

## 2018-05-17 NOTE — Patient Instructions (Signed)

## 2018-05-18 ENCOUNTER — Other Ambulatory Visit: Payer: Self-pay

## 2018-05-18 DIAGNOSIS — R002 Palpitations: Secondary | ICD-10-CM | POA: Insufficient documentation

## 2018-05-18 MED ORDER — CHLORPHEN-PE-ACETAMINOPHEN 4-10-325 MG PO TABS: 1.0000 | ORAL_TABLET | Freq: Two times a day (BID) | ORAL | 0 refills | Status: DC

## 2018-05-21 ENCOUNTER — Other Ambulatory Visit: Payer: Self-pay

## 2018-05-28 ENCOUNTER — Telehealth: Payer: Self-pay

## 2018-05-28 ENCOUNTER — Other Ambulatory Visit: Payer: Self-pay

## 2018-05-28 MED ORDER — CHLORPHEN-PE-ACETAMINOPHEN 4-10-325 MG PO TABS: 1.0000 | ORAL_TABLET | Freq: Two times a day (BID) | ORAL | 0 refills | Status: DC

## 2018-05-28 NOTE — Telephone Encounter (Signed)
Left the patient a message to call the office back. 

## 2018-05-28 NOTE — Telephone Encounter (Signed)
-----   Message from Glendale Chard, MD sent at 05/26/2018  4:15 PM EDT ----- She needs nurse visit in one month to recheck urinalysis.

## 2018-06-13 MED FILL — METOPROLOL SUCCINATE ER 25: 25 | 90 days supply | Qty: 90 | Fill #0

## 2018-06-26 ENCOUNTER — Other Ambulatory Visit: Payer: Self-pay

## 2018-06-26 ENCOUNTER — Ambulatory Visit (INDEPENDENT_AMBULATORY_CARE_PROVIDER_SITE_OTHER): Payer: No Typology Code available for payment source

## 2018-06-26 VITALS — BP 112/74 | HR 99 | Temp 97.9°F | Ht 63.6 in | Wt 160.6 lb

## 2018-06-26 DIAGNOSIS — R3129 Other microscopic hematuria: Secondary | ICD-10-CM

## 2018-06-26 LAB — POCT URINALYSIS DIPSTICK
Bilirubin, UA: NEGATIVE
Glucose, UA: NEGATIVE
Ketones, UA: NEGATIVE
Nitrite, UA: NEGATIVE
Protein, UA: NEGATIVE
Spec Grav, UA: >=1.03 — AB (ref 1.010–1.025)
Urobilinogen, UA: 0.2 E.U./dL
pH, UA: 6 (ref 5.0–8.0)

## 2018-06-27 ENCOUNTER — Telehealth: Payer: Self-pay

## 2018-06-27 NOTE — Telephone Encounter (Signed)
-----   Message from Glendale Chard, MD sent at 06/26/2018  5:25 PM EDT ----- Please let her know I would like to refer her for renal ultrasound. Is she willing to do this? If yes, please place order dx: microscopic hematuria ----- Message ----- From: Michelle Nasuti, CMA Sent: 06/26/2018   4:33 PM EDT To: Glendale Chard, MD  The pt had moderate amount of blood in her urine.

## 2018-06-27 NOTE — Telephone Encounter (Signed)
Left the pt a message to call the office back.  See previous phone note.

## 2018-07-16 LAB — HM COLONOSCOPY

## 2018-07-23 DIAGNOSIS — F419 Anxiety disorder, unspecified: Secondary | ICD-10-CM | POA: Insufficient documentation

## 2018-07-23 DIAGNOSIS — R000 Tachycardia, unspecified: Secondary | ICD-10-CM | POA: Insufficient documentation

## 2018-08-17 MED FILL — SERTRALINE HCL 100 MG TAB: 100 | 90 days supply | Qty: 90 | Fill #0

## 2018-08-28 ENCOUNTER — Other Ambulatory Visit: Payer: Self-pay | Admitting: Internal Medicine

## 2018-08-28 MED FILL — MAGNESIUM OXIDE 400 MG TABS: 400 | 90 days supply | Qty: 90 | Fill #0

## 2018-11-20 ENCOUNTER — Ambulatory Visit: Payer: No Typology Code available for payment source | Admitting: Internal Medicine

## 2018-12-07 MED FILL — METOPROLOL SUCCINATE ER 25: 25 | 90 days supply | Qty: 90 | Fill #0

## 2019-01-07 MED FILL — SERTRALINE HCL 100 MG TAB: 100 | 90 days supply | Qty: 90 | Fill #1

## 2019-03-27 ENCOUNTER — Telehealth: Payer: 59 | Admitting: Nurse Practitioner

## 2019-03-27 DIAGNOSIS — L700 Acne vulgaris: Secondary | ICD-10-CM | POA: Diagnosis not present

## 2019-03-27 MED ORDER — MINOCYCLINE HCL 50 MG PO TABS: 50.0000 mg | ORAL_TABLET | Freq: Two times a day (BID) | ORAL | 0 refills | Status: DC

## 2019-03-27 MED ORDER — CLINDAMYCIN PHOSPHATE 1 % EX LOTN: TOPICAL_LOTION | Freq: Two times a day (BID) | CUTANEOUS | 0 refills | Status: DC

## 2019-03-27 MED FILL — CLINDAMYCIN PHOSP 1% LOTION: 1 | 30 days supply | Qty: 60 | Fill #0

## 2019-03-27 MED FILL — MINOCYCLINE 50 MG CAPSULE: 50 | 10 days supply | Qty: 20 | Fill #0

## 2019-03-27 NOTE — Progress Notes (Signed)
We are sorry that you are experiencing this issue.  Here is how we plan to help!  Based on what you shared with me it looks like you have cystic acne.  Acne is a disorder of the hair follicles and oil glands (sebaceous glands). The sebaceous glands secrete oils to keep the skin moist.  When the glands get clogged, it can lead to pimples or cysts.  These cysts may become infected and leave scars. Acne is very common and normally occurs at puberty.  Acne is also inherited.  Your personal care plan consists of the following recommendations:  I recommend that you use a daily cleanser  You might try an over the counter cleanser that has benzoyl peroxide.  I recommend that you start with a product that has 2.5% benzoyl peroxide.  Stronger concentrations have not been shown to be more effective.  I have prescribed a topical gel with an antibiotic:  Clindamycin 1% lotion.  Apply the lotion to the affected skin twice daily.  Be sure to read the package insert to understand potential side effects,  I have also prescribed one of the following additional therapies:  Minocycline an oral antibiotic 50 mg twice a day   If excessive dryness or peeling occurs, reduce dose frequency or concentration of the topical scrubs.  If excessive stinging or burning occurs, remove the topical gel with mild soap and water and resume at a lower dose the next day.  Remember oral antibiotics and topical acne treatments may increase your sensitivity to the sun!  HOME CARE:  Do not squeeze pimples because that can often lead to infections, worse acne, and scars.  Use a moisturizer that contains retinoid or fruit acids that may inhibit the development of new acne lesions.  Although there is not a clear link that foods can cause acne, doctors do believe that too many sweets predispose you to skin problems.  GET HELP RIGHT AWAY IF:  If your acne gets worse or is not better within 10 days.  If you become depressed.  If  you become pregnant, discontinue medications and call your OB/GYN.  MAKE SURE YOU:  Understand these instructions.  Will watch your condition.  Will get help right away if you are not doing well or get worse.   Your e-visit answers were reviewed by a board certified advanced clinical practitioner to complete your personal care plan.  Depending upon the condition, your plan could have included both over the counter or prescription medications.  Please review your pharmacy choice.  If there is a problem, you may contact your provider through [employer] and have the prescription routed to another pharmacy.  Your safety is important to [place].  If you have drug allergies check your prescription carefully.  For the next 24 hours you can use MyChart to ask questions about today's visit, request a non-urgent call back, or ask for a work or school excuse from your e-visit provider.  You will get an email in the next two days asking about your experience. I hope that your e-visit has been valuable and will speed your recovery.  5-10 minutes spent reviewing and documenting in chart.

## 2019-03-29 ENCOUNTER — Ambulatory Visit (INDEPENDENT_AMBULATORY_CARE_PROVIDER_SITE_OTHER): Payer: 59

## 2019-03-29 ENCOUNTER — Encounter: Payer: Self-pay | Admitting: Orthopaedic Surgery

## 2019-03-29 ENCOUNTER — Other Ambulatory Visit: Payer: Self-pay

## 2019-03-29 ENCOUNTER — Ambulatory Visit: Payer: No Typology Code available for payment source | Admitting: Orthopaedic Surgery

## 2019-03-29 DIAGNOSIS — M545 Low back pain, unspecified: Secondary | ICD-10-CM

## 2019-03-29 DIAGNOSIS — G8929 Other chronic pain: Secondary | ICD-10-CM

## 2019-03-29 DIAGNOSIS — M25512 Pain in left shoulder: Secondary | ICD-10-CM | POA: Diagnosis not present

## 2019-03-29 MED ORDER — METHYLPREDNISOLONE ACETATE 40 MG/ML IJ SUSP
40.0000 mg | INTRAMUSCULAR | Status: AC | PRN
  Administered 2019-03-29: 10:00:00 40 mg via INTRA_ARTICULAR

## 2019-03-29 MED ORDER — LIDOCAINE HCL 2 % IJ SOLN
2.0000 mL | INTRAMUSCULAR | Status: AC | PRN
  Administered 2019-03-29: 10:00:00 2 mL

## 2019-03-29 MED ORDER — BUPIVACAINE HCL 0.25 % IJ SOLN
2.0000 mL | INTRAMUSCULAR | Status: AC | PRN
  Administered 2019-03-29: 10:00:00 2 mL via INTRA_ARTICULAR

## 2019-03-29 MED ORDER — PREDNISONE 10 MG (21) PO TBPK: ORAL_TABLET | ORAL | 0 refills | Status: DC

## 2019-03-29 MED FILL — predniSONE 10 MG TABS: 10 | 6 days supply | Qty: 21 | Fill #0

## 2019-03-29 NOTE — Progress Notes (Signed)
Office Visit Note   Patient: Cynthia Barron           Date of Birth: 1965/09/16           MRN: [id number] Visit Date: 03/29/2019              Requested by: Glendale Chard, [address] [place],  [place] [id number] PCP: Glendale Chard, MD   Assessment & Plan: Visit Diagnoses:  1. Chronic left shoulder pain   2. Chronic bilateral low back pain, unspecified whether sciatica present     Plan: Impression is left shoulder subacromial bursitis and chronic bilateral lower back pain.  In regards to the left shoulder, I injected the subacromial space with cortisone and have provided the patient with a gym exercise program.  She will let us know if she is still in pain over the course the next several weeks.  In regards to her lower back, she has tried steroids, muscle relaxers, formal physical therapy as well as epidural steroid injections all without long-term relief.  She notes that she has never had an MRI of her lumbar spine.  We will go ahead and obtain 1 at this time to assess for structural abnormalities.  We will also start her on a Sterapred taper to try and give her some relief while waiting for the MRI.  Follow-up with [place] once the MRI has been completed.  Follow-Up Instructions: Return for after MRI.   Orders:  Orders Placed This Encounter  Procedures  . Large Joint Inj  . XR Shoulder Left  . XR Lumbar Spine 2-3 Views   Meds ordered this encounter  Medications  . predniSONE (STERAPRED UNI-PAK 21 TAB) 10 MG (21) TBPK tablet    Sig: Take as directed    Dispense:  21 tablet    Refill:  0      Procedures: Large Joint Inj: L subacromial bursa on 03/29/2019 9:58 AM Indications: pain Details: 22 G needle Medications: 2 mL lidocaine 2 %; 2 mL bupivacaine 0.25 %; 40 mg methylPREDNISolone acetate 40 MG/ML Outcome: tolerated well, no immediate complications Patient was prepped and draped in the usual sterile fashion.       Clinical Data: No additional  findings.   Subjective: Chief Complaint  Patient presents with  . Left Shoulder - Pain  . Lower Back - Pain    HPI patient is a pleasant 54 year old female who comes in today with left shoulder pain and chronic bilateral lower back pain.  In regards to the left shoulder, her pain has been ongoing for the past year and has started to worsen.  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  The pain she has is to the superior aspect of the shoulder and radiates into the deltoid but occasionally down her entire arm and into her hand.  She notes a painful popping sensation to the top of the shoulder.  Sleeping on her left side as well as hot curling her hair seems to be most aggravating.  She has tried Aleve without relief of symptoms.  She does note intermittent numbness to all 5 fingers.  She denies any previous cortisone injection to the left shoulder.  Other issue she brings up today is chronic bilateral lower back pain for the past several years.  The pain she has is to both sides of the lower back and occasionally radiates down the left leg.  She occasionally notes pain into  the buttock is on both sides.  She has significant stiffness when she is in the flexed position and goes to stand up.  She also has trouble sleeping at night due to the pain.  Aleve seems to alleviate her symptoms, she has tried muscle relaxers that do not do anything other than put her to sleep.  She denies any numbness, tingling or burning down either lower extremity.  She has tried several sessions of physical therapy as well as previous ESI with no more than a couple days relief.  She denies any previous MRI of the lumbar spine.  Review of Systems as detailed in HPI.  All others reviewed and are negative.   Objective: Vital Signs: There were no vitals taken for this visit.  Physical Exam well-developed and well-nourished female in no acute distress.  Alert oriented  x3.  Ortho Exam examination of her left shoulder reveals full active range of motion all planes.  She can internally rotate to T12.  Positive empty can.  Negative cross body adduction.  No tenderness the AC joint.  Full strength throughout.  She is neurovascular intact distally.  Lumbar exam shows no spinous or paraspinous tenderness.  Negative straight leg raise negative logroll both sides.  No focal weakness.  She is neurovascular intact distally.  Specialty Comments:  No specialty comments available.  Imaging: XR Lumbar Spine 2-3 Views  Result Date: 03/29/2019 X-rays demonstrate diffuse spondylosis  XR Shoulder Left  Result Date: 03/29/2019 No acute or structural abnormalities    PMFS History: Patient Active Problem List   Diagnosis Date Noted  . Palpitations 05/18/2018  . Chronic left-sided low back pain with left-sided sciatica 05/17/2018  . Vertigo 05/17/2018  . IBS 12/27/2007  . Blood in stool 12/27/2007  . ABDOMINAL PAIN, LOWER 12/27/2007   History reviewed. No pertinent past medical history.  Family History  Problem Relation Age of Onset  . Parkinson's disease Mother   . Diabetes Mother   . Hypertension Mother   . Hyperlipidemia Mother   . Hypertension Father   . Hyperlipidemia Father     History reviewed. No pertinent surgical history. Social History   Occupational History  . Not on file  Tobacco Use  . Smoking status: Never Smoker  . Smokeless tobacco: Never Used  Substance and Sexual Activity  . Alcohol use: Not Currently  . Drug use: Never  . Sexual activity: Not on file

## 2019-04-18 ENCOUNTER — Other Ambulatory Visit: Payer: Self-pay | Admitting: Internal Medicine

## 2019-04-18 MED FILL — METOPROLOL SUCCINATE ER 25: 25 | 90 days supply | Qty: 90 | Fill #1

## 2019-04-22 ENCOUNTER — Other Ambulatory Visit: Payer: Self-pay | Admitting: Internal Medicine

## 2019-04-22 MED FILL — SERTRALINE HCL 100 MG TAB: 100 | 30 days supply | Qty: 30 | Fill #0

## 2019-04-24 ENCOUNTER — Telehealth: Payer: Self-pay | Admitting: Orthopaedic Surgery

## 2019-04-24 NOTE — Telephone Encounter (Signed)
Ok that's fine

## 2019-04-24 NOTE — Telephone Encounter (Signed)
Patient called and wanted to know if she can have her shoulder done as well w/the back MRI. Stated the injection didn't help any.  Please call patient to advise.  [phone]

## 2019-04-25 ENCOUNTER — Other Ambulatory Visit: Payer: Self-pay

## 2019-04-25 DIAGNOSIS — M25512 Pain in left shoulder: Secondary | ICD-10-CM

## 2019-04-25 DIAGNOSIS — G8929 Other chronic pain: Secondary | ICD-10-CM

## 2019-04-25 NOTE — Telephone Encounter (Signed)
Can MRI shoulder be added same day she having MRI Lspine?

## 2019-04-25 NOTE — Telephone Encounter (Signed)
Order for  MRI Shoulder  made.

## 2019-04-27 ENCOUNTER — Ambulatory Visit
Admission: RE | Admit: 2019-04-27 | Discharge: 2019-04-27 | Disposition: A | Discharge status: Home / Self Care | Payer: 59 | Source: Ambulatory Visit | Attending: Physician Assistant | Admitting: Physician Assistant

## 2019-04-27 DIAGNOSIS — G8929 Other chronic pain: Secondary | ICD-10-CM

## 2019-04-27 DIAGNOSIS — M48061 Spinal stenosis, lumbar region without neurogenic claudication: Secondary | ICD-10-CM | POA: Diagnosis not present

## 2019-04-27 DIAGNOSIS — M5126 Other intervertebral disc displacement, lumbar region: Secondary | ICD-10-CM | POA: Diagnosis not present

## 2019-04-27 DIAGNOSIS — M47812 Spondylosis without myelopathy or radiculopathy, cervical region: Secondary | ICD-10-CM | POA: Diagnosis not present

## 2019-04-29 ENCOUNTER — Other Ambulatory Visit: Payer: Self-pay | Admitting: Internal Medicine

## 2019-04-29 ENCOUNTER — Encounter: Payer: Self-pay | Admitting: Internal Medicine

## 2019-04-29 ENCOUNTER — Other Ambulatory Visit: Payer: Self-pay

## 2019-04-29 ENCOUNTER — Ambulatory Visit (INDEPENDENT_AMBULATORY_CARE_PROVIDER_SITE_OTHER): Payer: 59 | Admitting: Internal Medicine

## 2019-04-29 VITALS — BP 118/76 | HR 88 | Temp 98.2°F | Ht 63.6 in | Wt 165.0 lb

## 2019-04-29 DIAGNOSIS — M545 Low back pain: Secondary | ICD-10-CM | POA: Diagnosis not present

## 2019-04-29 DIAGNOSIS — Z712 Person consulting for explanation of examination or test findings: Secondary | ICD-10-CM | POA: Diagnosis not present

## 2019-04-29 DIAGNOSIS — G8929 Other chronic pain: Secondary | ICD-10-CM | POA: Diagnosis not present

## 2019-04-29 DIAGNOSIS — N281 Cyst of kidney, acquired: Secondary | ICD-10-CM | POA: Diagnosis not present

## 2019-04-29 DIAGNOSIS — L708 Other acne: Secondary | ICD-10-CM

## 2019-04-29 MED ORDER — SERTRALINE HCL 100 MG PO TABS: 100.0000 mg | ORAL_TABLET | Freq: Every day | ORAL | 2 refills | Status: DC

## 2019-04-29 MED ORDER — KETOROLAC TROMETHAMINE 60 MG/2ML IM SOLN
60.0000 mg | Freq: Once | INTRAMUSCULAR | Status: AC
  Administered 2019-04-29: 60 mg via INTRAMUSCULAR

## 2019-04-29 MED ORDER — METOPROLOL SUCCINATE ER 25 MG PO TB24: 25.0000 mg | ORAL_TABLET | Freq: Every day | ORAL | 2 refills | Status: DC

## 2019-04-29 MED ORDER — MAGNESIUM OXIDE 400 MG PO TABS: 1.0000 | ORAL_TABLET | Freq: Every day | ORAL | 2 refills | Status: DC

## 2019-04-29 NOTE — Patient Instructions (Addendum)
Calm, magnesium supplement  Vitamin C serum [facility] - may have to get at [facility]  Consider chiropractor

## 2019-04-29 NOTE — Progress Notes (Signed)
F/u to discuss

## 2019-05-03 ENCOUNTER — Other Ambulatory Visit: Payer: Self-pay

## 2019-05-03 ENCOUNTER — Encounter: Payer: Self-pay | Admitting: Orthopaedic Surgery

## 2019-05-03 ENCOUNTER — Ambulatory Visit (INDEPENDENT_AMBULATORY_CARE_PROVIDER_SITE_OTHER): Payer: 59 | Admitting: Orthopaedic Surgery

## 2019-05-03 VITALS — Ht 63.0 in | Wt 165.0 lb

## 2019-05-03 DIAGNOSIS — G8929 Other chronic pain: Secondary | ICD-10-CM

## 2019-05-03 DIAGNOSIS — M545 Low back pain: Secondary | ICD-10-CM

## 2019-05-03 MED ORDER — DICLOFENAC SODIUM 75 MG PO TBEC: 75.0000 mg | DELAYED_RELEASE_TABLET | Freq: Two times a day (BID) | ORAL | 2 refills | Status: DC

## 2019-05-03 MED FILL — DICLOFENAC SOD EC 75 MG TAB: 75 | 15 days supply | Qty: 30 | Fill #0

## 2019-05-03 NOTE — Progress Notes (Signed)
   Office Visit Note   Patient: Cynthia Barron           Date of Birth: [birth year]           MRN: [id number] Visit Date: 05/03/2019              Requested by: Glendale Chard, [address] [place],  [place] [address] PCP: Glendale Chard, MD   Assessment & Plan: Visit Diagnoses:  1. Chronic bilateral low back pain, unspecified whether sciatica present     Plan: Lumbar spine MRI shows advanced facet disease at L4-5 with moderate central stenosis.  These findings were reviewed in detail with the patient and based on our discussion she would like to be referred to Dr. Ernestina Patches for lumbar spine ESI.  I have sent in a prescription for diclofenac which will hopefully help her shoulder pain as well.  She will follow up with [place] after the shoulder MRI.  Follow-Up Instructions: Return if symptoms worsen or fail to improve.   Orders:  No orders of the defined types were placed in this encounter.  Meds ordered this encounter  Medications  . diclofenac (VOLTAREN) 75 MG EC tablet    Sig: Take 1 tablet (75 mg total) by mouth 2 (two) times daily.    Dispense:  30 tablet    Refill:  2      Procedures: No procedures performed   Clinical Data: No additional findings.   Subjective: Chief Complaint  Patient presents with  . Lower Back - Follow-up    MRI results    Patient returns today for follow-up of her left shoulder pain as well as her recent lumbar spine MRI.  Her shoulder is not any better.  She required an IM Toradol injection at her PCP office to give her some pain relief.  Her left shoulder MRI is pending.  In terms of her back she takes Aleve twice a day and she continues to have pain mainly in her back.  Does not sound like she has much radicular symptoms.   Review of Systems   Objective: Vital Signs: Ht 5\' 3"  (1.6 m)   Wt 165 lb (74.8 kg)   BMI 29.23 kg/m   Physical Exam  Ortho Exam Lumbar spine is unchanged Left shoulder is unchanged Specialty  Comments:  No specialty comments available.  Imaging: No results found.   PMFS History: Patient Active Problem List   Diagnosis Date Noted  . Chronic bilateral low back pain 05/03/2019  . Palpitations 05/18/2018  . Chronic left-sided low back pain with left-sided sciatica 05/17/2018  . Vertigo 05/17/2018  . IBS 12/27/2007  . Blood in stool 12/27/2007  . ABDOMINAL PAIN, LOWER 12/27/2007   Past Medical History:  Diagnosis Date  . Seasonal allergies   . Tachycardia     Family History  Problem Relation Age of Onset  . Parkinson's disease Mother   . Diabetes Mother   . Hypertension Mother   . Hyperlipidemia Mother   . Hypertension Father   . Hyperlipidemia Father     History reviewed. No pertinent surgical history. Social History   Occupational History  . Not on file  Tobacco Use  . Smoking status: Never Smoker  . Smokeless tobacco: Never Used  Substance and Sexual Activity  . Alcohol use: Not Currently  . Drug use: Never  . Sexual activity: Not on file

## 2019-05-03 NOTE — Addendum Note (Signed)
Addended by: Precious Bard on: 05/03/2019 08:47 AM   Modules accepted: Orders

## 2019-05-07 ENCOUNTER — Telehealth: Payer: Self-pay | Admitting: *Deleted

## 2019-05-07 NOTE — Telephone Encounter (Signed)
Received call from [employer] [facility] and she stated the pt's insurance terminated on 02/15/19 and not reactivation was done. I called pt so I can get verification and left vm on cell to reutrn my call.

## 2019-05-13 NOTE — Progress Notes (Signed)
This visit occurred during the SARS-CoV-2 public health emergency.  Safety protocols were in place, including screening questions prior to the visit, additional usage of staff PPE, and extensive cleaning of exam room while observing appropriate contact time as indicated for disinfecting solutions.  Subjective:     Patient ID: Cynthia Barron , female    DOB: [birth year] , 54 y.o.   MRN: [id number]   Chief Complaint  Patient presents with  . Follow-up  . Discuss cyst on kidney    HPI  She presents today for f/u chronic back pain. She is now under the care of Orthopedics. She recently had MRI, but no one has gone over the results yet. There is pain with ambulation. Pain also exacerbated by being seated for long periods of time. She is on her feet a lot at work, employed as a Charity fundraiser. Her pain is becoming more unbearable. Denies LE weakness/paresthesias.     Past Medical History:  Diagnosis Date  . Seasonal allergies   . Tachycardia      Family History  Problem Relation Age of Onset  . Parkinson's disease Mother   . Diabetes Mother   . Hypertension Mother   . Hyperlipidemia Mother   . Hypertension Father   . Hyperlipidemia Father      Current Outpatient Medications:  .  Chlorphen-PE-Acetaminophen (NOREL AD) 4-10-325 MG TABS, Take 1 tablet by mouth 2 (two) times daily., Disp: 20 tablet, Rfl: 0 .  clindamycin (CLEOCIN T) 1 % lotion, Apply topically 2 (two) times daily., Disp: 60 mL, Rfl: 0 .  levocetirizine (XYZAL) 5 MG tablet, Take 1 tablet (5 mg total) by mouth every evening., Disp: 90 tablet, Rfl: 2 .  diclofenac (VOLTAREN) 75 MG EC tablet, Take 1 tablet (75 mg total) by mouth 2 (two) times daily., Disp: 30 tablet, Rfl: 2 .  magnesium oxide (MAG-OX) 400 MG tablet, Take 1 tablet (400 mg total) by mouth daily., Disp: 90 tablet, Rfl: 2 .  metoprolol succinate (TOPROL-XL) 25 MG 24 hr tablet, Take 1 tablet (25 mg total) by mouth daily., Disp: 90 tablet, Rfl: 2 .  sertraline  (ZOLOFT) 100 MG tablet, Take 1 tablet (100 mg total) by mouth daily., Disp: 90 tablet, Rfl: 2   Allergies  Allergen Reactions  . Dicyclomine Hcl     REACTION: Dizziness  . Penicillins     REACTION: vomiting     Review of Systems  Constitutional: Negative.   Respiratory: Negative.   Cardiovascular: Negative.   Gastrointestinal: Negative.   Neurological: Negative.   Psychiatric/Behavioral: Negative.      Today's Vitals   04/29/19 1545  BP: 118/76  Pulse: 88  Temp: 98.2 F (36.8 C)  TempSrc: Oral  Weight: 165 lb (74.8 kg)  Height: 5' 3.6" (1.615 m)   Body mass index is 28.68 kg/m.   Objective:  Physical Exam Vitals and nursing note reviewed.  Constitutional:      Appearance: Normal appearance.  HENT:     Head: Normocephalic and atraumatic.  Cardiovascular:     Rate and Rhythm: Normal rate and regular rhythm.     Heart sounds: Normal heart sounds.  Pulmonary:     Effort: Pulmonary effort is normal.     Breath sounds: Normal breath sounds.  Skin:    General: Skin is warm.  Neurological:     General: No focal deficit present.     Mental Status: She is alert.  Psychiatric:        Mood and Affect: Mood  normal.        Behavior: Behavior normal.    MRI results:  Spondylosis is most notable at L4-5 where there is mild to moderate central canal stenosis and left greater than right subarticular recess narrowing which could irritate either descending L5 root. Moderate to advanced facet degenerative change is also seen at this level with associated facet effusions.       Assessment And Plan:     1. Chronic midline low back pain without sciatica  She was given Toradol, 60mg  IM x 1. She will continue with Aleve for now. She may benefit from tramadol. She is encouraged to continue with magnesium supplementation. Advised to switch to powder form once she is done with current supply of capsules. She may also benefit from chiropractic evaluation.   - ketorolac (TORADOL)  injection 60 mg  2. Other acne  Chronic. Encouraged to eliminate refined sugars and dairy from her diet.   3. Renal cyst  MRI results reviewed in full detail with the patient. Pt advised no need for further evaluation at this time.   Maximino Greenland, MD    THE PATIENT IS ENCOURAGED TO PRACTICE SOCIAL DISTANCING DUE TO THE COVID-19 PANDEMIC.

## 2019-05-20 ENCOUNTER — Encounter: Payer: No Typology Code available for payment source | Admitting: Internal Medicine

## 2019-05-25 ENCOUNTER — Other Ambulatory Visit: Payer: Self-pay

## 2019-05-25 ENCOUNTER — Ambulatory Visit
Admission: RE | Admit: 2019-05-25 | Discharge: 2019-05-25 | Disposition: A | Discharge status: Home / Self Care | Payer: 59 | Source: Ambulatory Visit | Attending: Orthopaedic Surgery | Admitting: Orthopaedic Surgery

## 2019-05-25 DIAGNOSIS — G8929 Other chronic pain: Secondary | ICD-10-CM

## 2019-05-25 DIAGNOSIS — S46012A Strain of muscle(s) and tendon(s) of the rotator cuff of left shoulder, initial encounter: Secondary | ICD-10-CM | POA: Diagnosis not present

## 2019-05-25 DIAGNOSIS — M25512 Pain in left shoulder: Secondary | ICD-10-CM

## 2019-05-27 ENCOUNTER — Encounter: Payer: Self-pay | Admitting: Physical Medicine and Rehabilitation

## 2019-05-27 ENCOUNTER — Other Ambulatory Visit: Payer: Self-pay

## 2019-05-27 ENCOUNTER — Ambulatory Visit: Payer: Self-pay

## 2019-05-27 ENCOUNTER — Ambulatory Visit (INDEPENDENT_AMBULATORY_CARE_PROVIDER_SITE_OTHER): Payer: 59 | Admitting: Physical Medicine and Rehabilitation

## 2019-05-27 VITALS — BP 127/89 | HR 80

## 2019-05-27 DIAGNOSIS — M5416 Radiculopathy, lumbar region: Secondary | ICD-10-CM | POA: Diagnosis not present

## 2019-05-27 DIAGNOSIS — M48062 Spinal stenosis, lumbar region with neurogenic claudication: Secondary | ICD-10-CM

## 2019-05-27 MED ORDER — METHYLPREDNISOLONE ACETATE 80 MG/ML IJ SUSP
40.0000 mg | Freq: Once | INTRAMUSCULAR | Status: AC
  Administered 2019-05-27: 40 mg

## 2019-05-27 NOTE — Progress Notes (Signed)
Numeric Pain Rating Scale and Functional Assessment Average Pain 10   In the last MONTH (on 0-10 scale) has pain interfered with the following?  1. General activity like being  able to carry out your everyday physical activities such as walking, climbing stairs, carrying groceries, or moving a chair?  Rating(10)   +Driver, -BT, -Dye Allergies. 

## 2019-05-28 ENCOUNTER — Ambulatory Visit: Payer: No Typology Code available for payment source | Admitting: Orthopaedic Surgery

## 2019-05-28 MED FILL — DICLOFENAC SOD EC 75 MG TAB: 75 | 15 days supply | Qty: 30 | Fill #1

## 2019-05-28 NOTE — Progress Notes (Signed)
Cynthia Barron - 54 y.o. female MRN [id number]  Date of birth: [birth year]  Office Visit Note: Visit Date: 05/27/2019 PCP: Glendale Chard, MD Referred by: Glendale Chard, MD  Subjective: Chief Complaint  Patient presents with  . Lower Back - Pain  . Left Leg - Pain   HPI:  Cynthia Barron is a 54 y.o. female who comes in today At the request of Dr. Monna Fam for lumbar interventional spine procedure.  She has been having pain really gradually worsening over the last 5 to 6 years but especially over the last few months.  No specific individual trauma to the spine but has had some trauma off and on.  She has worsening pain in the left side radiating into the left leg without paresthesia.  She rates her pain as 10 out of 10 despite medication use and treatment.  She reports worsening with laying down and bending.  She uses a pillow behind her back and that seems to help a little bit as does Aleve.  MRI is reviewed below showing mostly facet arthropathy at L4-5 which is fairly significant at 54 years old.  She does not have any high-grade central stenosis she has some lateral recess narrowing.  No focal disc herniation.  Initial diagnostic and hopefully therapeutic injection would be left L4-5 interlaminar epidural steroid injection.  Depending on relief would look at facet joint blocks.  She will continue with home exercises.  ROS Otherwise per HPI.  Assessment & Plan: Visit Diagnoses:  1. Lumbar radiculopathy   2. Spinal stenosis of lumbar region with neurogenic claudication     Plan: No additional findings.   Meds & Orders:  Meds ordered this encounter  Medications  . methylPREDNISolone acetate (DEPO-MEDROL) injection 40 mg    Orders Placed This Encounter  Procedures  . XR C-ARM NO REPORT  . Epidural Steroid injection    Follow-up: Return if symptoms worsen or fail to improve.   Procedures: No procedures performed  Lumbar Epidural Steroid Injection - Interlaminar  Approach with Fluoroscopic Guidance  Patient: Cynthia Barron      Date of Birth: March 09, 1965 MRN: [id number] PCP: Glendale Chard, MD      Visit Date: 05/27/2019   Universal Protocol:     Consent Given By: the patient  Position: PRONE  Additional Comments: Vital signs were monitored before and after the procedure. Patient was prepped and draped in the usual sterile fashion. The correct patient, procedure, and site was verified.   Injection Procedure Details:  Procedure Site One Meds Administered:  Meds ordered this encounter  Medications  . methylPREDNISolone acetate (DEPO-MEDROL) injection 40 mg     Laterality: Left  Location/Site:  L4-L5  Needle size: 20 G  Needle type: Tuohy  Needle Placement: Paramedian epidural  Findings:   -Comments: Excellent flow of contrast into the epidural space.  Initial position of the needle was more midline and actually flow of contrast from the midline actually went into the left facet joint capsule.  This probably likely shows increased synovial tissue further out than the arthritic joint which we do see from time to time.  Repositioning we did get good epidural flow.  Procedure Details: Using a paramedian approach from the side mentioned above, the region overlying the inferior lamina was localized under fluoroscopic visualization and the soft tissues overlying this structure were infiltrated with 4 ml. of 1% Lidocaine without Epinephrine. The Tuohy needle was inserted into the epidural space using a paramedian approach.   The  epidural space was localized using loss of resistance along with lateral and bi-planar fluoroscopic views.  After negative aspirate for air, blood, and CSF, a 2 ml. volume of Isovue-250 was injected into the epidural space and the flow of contrast was observed. Radiographs were obtained for documentation purposes.    The injectate was administered into the level noted above.   Additional Comments:  The  patient tolerated the procedure well Dressing: 2 x 2 sterile gauze and Band-Aid    Post-procedure details: Patient was observed during the procedure. Post-procedure instructions were reviewed.  Patient left the clinic in stable condition.    Clinical History: MRI LUMBAR SPINE WITHOUT CONTRAST  TECHNIQUE: Multiplanar, multisequence MR imaging of the lumbar spine was performed. No intravenous contrast was administered.  COMPARISON:  Plain films lumbar spine 03/29/2019.  FINDINGS: Segmentation:  Standard.  Alignment: Mild convex left curvature. Trace anterolisthesis L4 on L5.  Vertebrae:  No fracture, evidence of discitis, or bone lesion.  Conus medullaris and cauda equina: Conus extends to the L1-2 level. Conus and cauda equina appear normal.  Paraspinal and other soft tissues: Small cyst right kidney noted.  Disc levels:  T11-12 is imaged in the sagittal plane only and negative.  T12-L1: Negative.  L1-2: Negative.  L2-3: Mild facet degenerative change.  Otherwise negative.  L3-4: Negative.  L4-5: There is a shallow disc bulge, moderate to advanced facet arthropathy with associated small effusions. Ligamentum flavum thickening is also seen. Mild to moderate central canal stenosis is present with narrowing in the subarticular recesses, worse on the left. Mild bilateral foraminal narrowing is also seen.  L5-S1: Mild-to-moderate facet degenerative disease. The wise negative.  IMPRESSION: Spondylosis is most notable at L4-5 where there is mild to moderate central canal stenosis and left greater than right subarticular recess narrowing which could irritate either descending L5 root. Moderate to advanced facet degenerative change is also seen at this level with associated facet effusions.   Electronically Signed   By: Inge Rise M.D.   On: 04/27/2019 16:12     Objective:  VS:  HT:    WT:   BMI:     BP:127/89  HR:80bpm  TEMP: ( )   RESP:  Physical Exam  Ortho Exam Imaging: XR C-ARM NO REPORT  Result Date: 05/27/2019 Please see Notes tab for imaging impression.

## 2019-05-28 NOTE — Procedures (Signed)
Lumbar Epidural Steroid Injection - Interlaminar Approach with Fluoroscopic Guidance  Patient: Cynthia Barron      Date of Birth: 02/01/1966 MRN: [id number] PCP: Glendale Chard, MD      Visit Date: 05/27/2019   Universal Protocol:     Consent Given By: the patient  Position: PRONE  Additional Comments: Vital signs were monitored before and after the procedure. Patient was prepped and draped in the usual sterile fashion. The correct patient, procedure, and site was verified.   Injection Procedure Details:  Procedure Site One Meds Administered:  Meds ordered this encounter  Medications  . methylPREDNISolone acetate (DEPO-MEDROL) injection 40 mg     Laterality: Left  Location/Site:  L4-L5  Needle size: 20 G  Needle type: Tuohy  Needle Placement: Paramedian epidural  Findings:   -Comments: Excellent flow of contrast into the epidural space.  Initial position of the needle was more midline and actually flow of contrast from the midline actually went into the left facet joint capsule.  This probably likely shows increased synovial tissue further out than the arthritic joint which we do see from time to time.  Repositioning we did get good epidural flow.  Procedure Details: Using a paramedian approach from the side mentioned above, the region overlying the inferior lamina was localized under fluoroscopic visualization and the soft tissues overlying this structure were infiltrated with 4 ml. of 1% Lidocaine without Epinephrine. The Tuohy needle was inserted into the epidural space using a paramedian approach.   The epidural space was localized using loss of resistance along with lateral and bi-planar fluoroscopic views.  After negative aspirate for air, blood, and CSF, a 2 ml. volume of Isovue-250 was injected into the epidural space and the flow of contrast was observed. Radiographs were obtained for documentation purposes.    The injectate was administered into the level  noted above.   Additional Comments:  The patient tolerated the procedure well Dressing: 2 x 2 sterile gauze and Band-Aid    Post-procedure details: Patient was observed during the procedure. Post-procedure instructions were reviewed.  Patient left the clinic in stable condition.

## 2019-05-29 ENCOUNTER — Ambulatory Visit: Payer: 59 | Admitting: Orthopaedic Surgery

## 2019-05-29 ENCOUNTER — Encounter: Payer: Self-pay | Admitting: Orthopaedic Surgery

## 2019-05-29 ENCOUNTER — Other Ambulatory Visit: Payer: Self-pay

## 2019-05-29 DIAGNOSIS — M67922 Unspecified disorder of synovium and tendon, left upper arm: Secondary | ICD-10-CM

## 2019-05-29 DIAGNOSIS — S46812A Strain of other muscles, fascia and tendons at shoulder and upper arm level, left arm, initial encounter: Secondary | ICD-10-CM | POA: Diagnosis not present

## 2019-05-29 MED ORDER — TRAMADOL HCL 50 MG PO TABS: 50.0000 mg | ORAL_TABLET | Freq: Every evening | ORAL | 2 refills | Status: DC | PRN

## 2019-05-29 MED FILL — traMADol HCL 50 MG TABS: 50 | 15 days supply | Qty: 30 | Fill #0

## 2019-05-29 NOTE — Progress Notes (Signed)
Office Visit Note   Patient: Cynthia Barron           Date of Birth: 1965/02/21           MRN: [id number] Visit Date: 05/29/2019              Requested by: Glendale Chard, [address] [place],  [place] [address] PCP: Glendale Chard, MD   Assessment & Plan: Visit Diagnoses:  1. Traumatic tear of supraspinatus tendon of left shoulder, initial encounter   2. Tendinopathy of left biceps tendon     Plan: MRI of the right shoulder shows a full-thickness supraspinatus tear with mild retraction.  Intra-articular biceps exhibits significant tendinopathy.  These findings were reviewed with the patient in detail and we went through the images together.  Recommendations for surgical repair and biceps tenodesis and based on our discussion of risk benefits alternatives and rehab and recovery patient has elected to move forward with scheduling arthroscopic rotator cuff repair and biceps tenodesis.  Tramadol prescribed for nighttime pain.  Follow-Up Instructions: Return if symptoms worsen or fail to improve.   Orders:  No orders of the defined types were placed in this encounter.  Meds ordered this encounter  Medications  . traMADol (ULTRAM) 50 MG tablet    Sig: Take 1-2 tablets (50-100 mg total) by mouth at bedtime as needed.    Dispense:  30 tablet    Refill:  2      Procedures: No procedures performed   Clinical Data: No additional findings.   Subjective: Chief Complaint  Patient presents with  . Lower Back - Pain    Patient returns today for MRI review of the right shoulder.  No changes in medical history.   Review of Systems  Constitutional: Negative.   HENT: Negative.   Eyes: Negative.   Respiratory: Negative.   Cardiovascular: Negative.   Endocrine: Negative.   Musculoskeletal: Negative.   Neurological: Negative.   Hematological: Negative.   Psychiatric/Behavioral: Negative.   All other systems reviewed and are  negative.    Objective: Vital Signs: There were no vitals taken for this visit.  Physical Exam Vitals and nursing note reviewed.  Constitutional:      Appearance: She is well-developed.  Pulmonary:     Effort: Pulmonary effort is normal.  Skin:    General: Skin is warm.     Capillary Refill: Capillary refill takes less than 2 seconds.  Neurological:     Mental Status: She is alert and oriented to person, place, and time.  Psychiatric:        Behavior: Behavior normal.        Thought Content: Thought content normal.        Judgment: Judgment normal.     Ortho Exam Right shoulder exam is unchanged. Specialty Comments:  No specialty comments available.  Imaging: No results found.   PMFS History: Patient Active Problem List   Diagnosis Date Noted  . Traumatic tear of supraspinatus tendon of left shoulder 05/29/2019  . Tendinopathy of left biceps tendon 05/29/2019  . Chronic bilateral low back pain 05/03/2019  . Palpitations 05/18/2018  . Chronic left-sided low back pain with left-sided sciatica 05/17/2018  . Vertigo 05/17/2018  . IBS 12/27/2007  . Blood in stool 12/27/2007  . ABDOMINAL PAIN, LOWER 12/27/2007   Past Medical History:  Diagnosis Date  . Seasonal allergies   . Tachycardia     Family History  Problem Relation Age of Onset  . Parkinson's  disease Mother   . Diabetes Mother   . Hypertension Mother   . Hyperlipidemia Mother   . Hypertension Father   . Hyperlipidemia Father     History reviewed. No pertinent surgical history. Social History   Occupational History  . Not on file  Tobacco Use  . Smoking status: Never Smoker  . Smokeless tobacco: Never Used  Substance and Sexual Activity  . Alcohol use: Not Currently  . Drug use: Never  . Sexual activity: Not on file

## 2019-05-31 ENCOUNTER — Ambulatory Visit: Payer: No Typology Code available for payment source | Admitting: Orthopaedic Surgery

## 2019-06-05 ENCOUNTER — Encounter: Payer: Self-pay | Admitting: Internal Medicine

## 2019-07-01 MED FILL — SERTRALINE HCL 100 MG TAB: 100 | 90 days supply | Qty: 90 | Fill #0

## 2019-07-01 MED FILL — traMADol HCL 50 MG TABS: 50 | 15 days supply | Qty: 30 | Fill #1

## 2019-07-25 ENCOUNTER — Encounter: Payer: 59 | Admitting: Internal Medicine

## 2019-08-15 ENCOUNTER — Encounter: Payer: Self-pay | Admitting: Orthopaedic Surgery

## 2019-08-15 NOTE — Telephone Encounter (Signed)
Patient stopped by office earlier today and we scheduled surgery.

## 2019-09-02 DIAGNOSIS — Z1231 Encounter for screening mammogram for malignant neoplasm of breast: Secondary | ICD-10-CM | POA: Diagnosis not present

## 2019-09-02 DIAGNOSIS — Z6827 Body mass index (BMI) 27.0-27.9, adult: Secondary | ICD-10-CM | POA: Diagnosis not present

## 2019-09-02 DIAGNOSIS — Z01419 Encounter for gynecological examination (general) (routine) without abnormal findings: Secondary | ICD-10-CM | POA: Diagnosis not present

## 2019-09-02 LAB — HM MAMMOGRAPHY

## 2019-09-02 MED FILL — METOPROLOL SUCCINATE ER 25: 25 | 90 days supply | Qty: 90 | Fill #0

## 2019-09-02 MED FILL — traMADol HCL 50 MG TABS: 50 | 15 days supply | Qty: 30 | Fill #2

## 2019-09-02 MED FILL — DICLOFENAC SOD EC 75 MG TAB: 75 | 15 days supply | Qty: 30 | Fill #2

## 2019-09-09 ENCOUNTER — Other Ambulatory Visit: Payer: Self-pay

## 2019-09-09 ENCOUNTER — Other Ambulatory Visit: Payer: Self-pay | Admitting: Nurse Practitioner

## 2019-09-09 ENCOUNTER — Encounter: Payer: Self-pay | Admitting: Nurse Practitioner

## 2019-09-09 ENCOUNTER — Ambulatory Visit: Payer: 59 | Admitting: Nurse Practitioner

## 2019-09-09 VITALS — BP 118/80 | HR 108 | Temp 98.3°F | Ht 63.6 in | Wt 160.8 lb

## 2019-09-09 DIAGNOSIS — G8929 Other chronic pain: Secondary | ICD-10-CM

## 2019-09-09 DIAGNOSIS — F419 Anxiety disorder, unspecified: Secondary | ICD-10-CM | POA: Diagnosis not present

## 2019-09-09 DIAGNOSIS — Z0000 Encounter for general adult medical examination without abnormal findings: Secondary | ICD-10-CM | POA: Diagnosis not present

## 2019-09-09 DIAGNOSIS — Z114 Encounter for screening for human immunodeficiency virus [HIV]: Secondary | ICD-10-CM | POA: Diagnosis not present

## 2019-09-09 DIAGNOSIS — M25512 Pain in left shoulder: Secondary | ICD-10-CM

## 2019-09-09 DIAGNOSIS — R7309 Other abnormal glucose: Secondary | ICD-10-CM | POA: Diagnosis not present

## 2019-09-09 DIAGNOSIS — G4701 Insomnia due to medical condition: Secondary | ICD-10-CM

## 2019-09-09 DIAGNOSIS — E782 Mixed hyperlipidemia: Secondary | ICD-10-CM

## 2019-09-09 MED ORDER — SERTRALINE HCL 100 MG PO TABS: 100.0000 mg | ORAL_TABLET | Freq: Every day | ORAL | 2 refills | Status: DC

## 2019-09-09 MED ORDER — HYDROXYZINE PAMOATE 25 MG PO CAPS: 25.0000 mg | ORAL_CAPSULE | Freq: Three times a day (TID) | ORAL | 0 refills | Status: DC | PRN

## 2019-09-09 MED ORDER — TRAZODONE HCL 50 MG PO TABS: 50.0000 mg | ORAL_TABLET | Freq: Every day | ORAL | 2 refills | Status: DC

## 2019-09-09 MED FILL — traZODone HCL 50 MG TABS: 50 | 30 days supply | Qty: 30 | Fill #0

## 2019-09-09 MED FILL — HYDROXYZINE PAM 25 MG CAP: 25 | 10 days supply | Qty: 30 | Fill #0

## 2019-09-09 NOTE — Progress Notes (Signed)
°This visit occurred during the SARS-CoV-2 public health emergency.  Safety protocols were in place, including screening questions prior to the visit, additional usage of staff PPE, and extensive cleaning of exam room while observing appropriate contact time as indicated for disinfecting solutions. ° °Subjective:  °  ° Patient ID: Cynthia Barron , female    DOB: 06/18/1965 , 54 y.o.   MRN: [id number] ° ° °Chief Complaint  °Patient presents with  °• Annual Exam  ° ° °HPI ° °Presents today for her annual HM exam.  She is scheduled for left rotator cuff repair on August the 12 th. She has already had her clearance. She has had a steroid injection in her back that is better. She is following a low carb diet and is limited with her back and shoulder for exercise. She has plans to go to the optometrist and dentist on December the 13. She [facility]. She is interested in HIV screening. She needs refills on the Zoloft and metoprolol. ° °She is beginning menopause and has started hot flashes and the chronic pain. She is interested in a non addictive sleep aid and an adjustment to her Zoloft. She reports some increased anxiety in social situatioons. LMP was December 2019.  ° °She ate today at 12 ° °She is having difficulty with sleeping due to pain and hot flashes.  Would like a medication that is non sedating to help with sleep. She has tried melatonin without benefit.   °  ° °Past Medical History:  °Diagnosis Date  °• Seasonal allergies   °• Tachycardia   °  ° °Family History  °Problem Relation Age of Onset  °• Parkinson's disease Mother   °• Diabetes Mother   °• Hypertension Mother   °• Hyperlipidemia Mother   °• Hypertension Father   °• Hyperlipidemia Father   ° ° ° °Current Outpatient Medications:  °•  diclofenac (VOLTAREN) 75 MG EC tablet, Take 1 tablet (75 mg total) by mouth 2 (two) times daily., Disp: 30 tablet, Rfl: 2 °•  levocetirizine (XYZAL) 5 MG  tablet, Take 1 tablet (5 mg total) by mouth every evening., Disp: 90 tablet, Rfl: 2 °•  magnesium oxide (MAG-OX) 400 MG tablet, Take 1 tablet (400 mg total) by mouth daily., Disp: 90 tablet, Rfl: 2 °•  metoprolol succinate (TOPROL-XL) 25 MG 24 hr tablet, Take 1 tablet (25 mg total) by mouth daily., Disp: 90 tablet, Rfl: 2 °•  sertraline (ZOLOFT) 100 MG tablet, Take 1 tablet (100 mg total) by mouth daily., Disp: 90 tablet, Rfl: 2 °•  traMADol (ULTRAM) 50 MG tablet, Take 1-2 tablets (50-100 mg total) by mouth at bedtime as needed., Disp: 30 tablet, Rfl: 2 °•  hydrOXYzine (VISTARIL) 25 MG capsule, Take 1 capsule (25 mg total) by mouth 3 (three) times daily as needed., Disp: 30 capsule, Rfl: 0 °•  traZODone (DESYREL) 50 MG tablet, Take 1 tablet (50 mg total) by mouth at bedtime., Disp: 30 tablet, Rfl: 2  ° °Allergies  °Allergen Reactions  °• Dicyclomine Hcl   °  REACTION: Dizziness  °• Penicillins   °  REACTION: vomiting  °  ° ° °The patient states she uses post menopausal status for birth control. Last LMP was No LMP recorded. (Menstrual status: Oral contraceptives).. Negative for Dysmenorrhea and Negative for Menorrhagia. Negative for: breast discharge, breast lump(s), breast pain and breast self exam. Associated symptoms include abnormal vaginal bleeding. Pertinent negatives include abnormal   bleeding (hematology), anxiety, decreased libido, depression, difficulty falling sleep, dyspareunia, history of infertility, nocturia, sexual dysfunction, sleep disturbances, urinary incontinence, urinary urgency, vaginal discharge and vaginal itching. Diet low carb.The patient states her exercise level is limited due to repetative movement, she pushes carts for phlebotomy.   ° °The patient's tobacco use is:  °Social History  ° °Tobacco Use  °Smoking Status Never Smoker  °Smokeless Tobacco Never Used  ° °She has been exposed to passive smoke. The patient's alcohol use is:  °Social History  ° °Substance and Sexual Activity   °Alcohol Use Not Currently  ° °Additional information: Last pap 09/02/2019, next one scheduled for *09/01/2020 ° °Review of Systems  °Constitutional: Negative.   °HENT: Negative.   °Eyes: Negative.   °Respiratory: Negative.   °Cardiovascular: Negative.   °Gastrointestinal: Negative.   °Endocrine: Negative.   °Genitourinary: Negative.   °Musculoskeletal: Positive for arthralgias. Negative for back pain.  °     Rotator chronic pain  °Skin: Negative.   °Allergic/Immunologic: Negative.   °Neurological: Negative.   °Hematological: Negative.   °Psychiatric/Behavioral: Positive for sleep disturbance. The patient is nervous/anxious.   °     Having some anxiety in public and sleep problems.  °  ° °Today's Vitals  ° 09/09/19 1438  °BP: 118/80  °Pulse: (!) 108  °Temp: 98.3 °F (36.8 °C)  °Weight: 160 lb 12.8 oz (72.9 kg)  °Height: 5' 3.6" (1.615 m)  °PainSc: 0-No pain  ° °Body mass index is 27.95 kg/m².  ° °Objective:  °Physical Exam °Constitutional:   °   General: She is not in acute distress. °   Appearance: Normal appearance. She is normal weight.  °HENT:  °   Head: Normocephalic and atraumatic.  °   Right Ear: Tympanic membrane and external ear normal. There is no impacted cerumen.  °   Left Ear: Tympanic membrane, ear canal and external ear normal. There is no impacted cerumen.  °   Nose:  °   Comments: Deferred wearing mask °   Mouth/Throat:  °   Comments: Deferred wearing mask °Eyes:  °   Extraocular Movements: Extraocular movements intact.  °   Conjunctiva/sclera: Conjunctivae normal.  °   Pupils: Pupils are equal, round, and reactive to light.  °Neck:  °   Vascular: No carotid bruit.  °Cardiovascular:  °   Rate and Rhythm: Normal rate.  °   Pulses: Normal pulses.  °   Heart sounds: Normal heart sounds. No murmur heard.  ° °Pulmonary:  °   Effort: Pulmonary effort is normal.  °   Breath sounds: Normal breath sounds.  °Abdominal:  °   General: Abdomen is flat. Bowel sounds are normal. There is no distension.  °    Palpations: Abdomen is soft.  °Musculoskeletal:     °   General: Normal range of motion.  °   Cervical back: Normal range of motion. No tenderness.  °Lymphadenopathy:  °   Cervical: No cervical adenopathy.  °Skin: °   General: Skin is warm and dry.  °   Capillary Refill: Capillary refill takes less than 2 seconds.  °Neurological:  °   General: No focal deficit present.  °   Mental Status: She is alert and oriented to person, place, and time.  °   Cranial Nerves: No cranial nerve deficit.  °Psychiatric:     °   Mood and Affect: Mood normal.     °   Behavior: Behavior normal.     °     Thought Content: Thought content normal.        Judgment: Judgment normal.         Assessment And Plan:     1. Routine general medical examination at health care facility  Behavior modifications discussed and diet history reviewed.    Pt will continue to exercise regularly and modify diet with low GI, plant based foods and decrease intake of processed foods.   Recommend intake of daily multivitamin, Vitamin D, and calcium.   Recommend for preventive screenings, as well as recommend immunizations that include influenza, TDAP  2. Encounter for screening for HIV - HIV Antibody (routine testing w rflx) - CBC  3. Abnormal glucose  Chronic, stable  No current medications  Encouraged to limit intake of sugary foods and drinks  Encouraged to increase physical activity to 150 minutes per week  4. Mixed hyperlipidemia  Chronic, controlled  Continue with current medications - Lipid panel - CMP14+EGFR - CBC  5. Chronic left shoulder pain  Being followed by orthopedics  6. Insomnia due to medical condition  Will try her on trazodone and check for metabolic cause  Avoid caffeine - TSH - traZODone (DESYREL) 50 MG tablet; Take 1 tablet (50 mg total) by mouth at bedtime.  Dispense: 30 tablet; Refill: 2  7. Anxiety  She is to use vistaril for anxiety and continue sertraline - sertraline (ZOLOFT) 100  MG tablet; Take 1 tablet (100 mg total) by mouth daily.  Dispense: 90 tablet; Refill: 2 - hydrOXYzine (VISTARIL) 25 MG capsule; Take 1 capsule (25 mg total) by mouth 3 (three) times daily as needed.  Dispense: 30 capsule; Refill: 0     Patient was given opportunity to ask questions. Patient verbalized understanding of the plan and was able to repeat key elements of the plan. All questions were answered to their satisfaction.   Minette Brine, FNP   I, Minette Brine, FNP, have reviewed all documentation for this visit. The documentation on 09/15/19 for the exam, diagnosis, procedures, and orders are all accurate and complete.  THE PATIENT IS ENCOURAGED TO PRACTICE SOCIAL DISTANCING DUE TO THE COVID-19 PANDEMIC.

## 2019-09-10 LAB — CMP14+EGFR
ALT: 30 IU/L (ref 0–32)
AST: 35 IU/L (ref 0–40)
Albumin/Globulin Ratio: 1.7 (ref 1.2–2.2)
Albumin: 4.6 g/dL (ref 3.8–4.9)
Alkaline Phosphatase: 97 IU/L (ref 48–121)
BUN/Creatinine Ratio: 23 (ref 9–23)
BUN: 18 mg/dL (ref 6–24)
Bilirubin Total: 0.2 mg/dL (ref 0.0–1.2)
CO2: 23 mmol/L (ref 20–29)
Calcium: 9.6 mg/dL (ref 8.7–10.2)
Chloride: 102 mmol/L (ref 96–106)
Creatinine, Ser: 0.79 mg/dL (ref 0.57–1.00)
GFR calc Af Amer: 97 mL/min/{1.73_m2} (ref >59)
GFR calc non Af Amer: 85 mL/min/{1.73_m2} (ref >59)
Globulin, Total: 2.7 g/dL (ref 1.5–4.5)
Glucose: 90 mg/dL (ref 65–99)
Potassium: 4.1 mmol/L (ref 3.5–5.2)
Sodium: 139 mmol/L (ref 134–144)
Total Protein: 7.3 g/dL (ref 6.0–8.5)

## 2019-09-10 LAB — CBC
Hematocrit: 39.4 % (ref 34.0–46.6)
Hemoglobin: 12.5 g/dL (ref 11.1–15.9)
MCH: 28.7 pg (ref 26.6–33.0)
MCHC: 31.7 g/dL (ref 31.5–35.7)
MCV: 90 fL (ref 79–97)
Platelets: 319 10*3/uL (ref 150–450)
RBC: 4.36 x10E6/uL (ref 3.77–5.28)
RDW: 12.2 % (ref 11.7–15.4)
WBC: 7.2 10*3/uL (ref 3.4–10.8)

## 2019-09-10 LAB — LIPID PANEL
Chol/HDL Ratio: 4.8 ratio — ABNORMAL HIGH (ref 0.0–4.4)
Cholesterol, Total: 235 mg/dL — ABNORMAL HIGH (ref 100–199)
HDL: 49 mg/dL (ref >39)
LDL Chol Calc (NIH): 153 mg/dL — ABNORMAL HIGH (ref 0–99)
Triglycerides: 182 mg/dL — ABNORMAL HIGH (ref 0–149)
VLDL Cholesterol Cal: 33 mg/dL (ref 5–40)

## 2019-09-10 LAB — TSH: TSH: 2.02 u[IU]/mL (ref 0.450–4.500)

## 2019-09-10 LAB — HIV ANTIBODY (ROUTINE TESTING W REFLEX): HIV Screen 4th Generation wRfx: NONREACTIVE

## 2019-09-11 MED FILL — SERTRALINE HCL 100 MG TAB: 100 | 90 days supply | Qty: 90 | Fill #0

## 2019-09-17 ENCOUNTER — Encounter: Payer: Self-pay | Admitting: Nurse Practitioner

## 2019-09-24 ENCOUNTER — Other Ambulatory Visit: Payer: Self-pay | Admitting: Physician Assistant

## 2019-09-24 MED ORDER — OXYCODONE-ACETAMINOPHEN 5-325 MG PO TABS: 1.0000 | ORAL_TABLET | Freq: Three times a day (TID) | ORAL | 0 refills | Status: DC | PRN

## 2019-09-24 MED ORDER — ONDANSETRON HCL 4 MG PO TABS: 4.0000 mg | ORAL_TABLET | Freq: Three times a day (TID) | ORAL | 0 refills | Status: DC | PRN

## 2019-09-24 MED FILL — OXYCODONE-APAP 5-325MG: 5-325 | 5 days supply | Qty: 30 | Fill #0

## 2019-09-24 MED FILL — ONDANSETRON HCL 4 MG TABLET: 4 | 13 days supply | Qty: 40 | Fill #0

## 2019-09-26 ENCOUNTER — Encounter: Payer: Self-pay | Admitting: Orthopaedic Surgery

## 2019-09-26 DIAGNOSIS — M94212 Chondromalacia, left shoulder: Secondary | ICD-10-CM | POA: Diagnosis not present

## 2019-09-26 DIAGNOSIS — S43432A Superior glenoid labrum lesion of left shoulder, initial encounter: Secondary | ICD-10-CM | POA: Diagnosis not present

## 2019-09-26 DIAGNOSIS — M7542 Impingement syndrome of left shoulder: Secondary | ICD-10-CM | POA: Diagnosis not present

## 2019-09-26 DIAGNOSIS — S46012A Strain of muscle(s) and tendon(s) of the rotator cuff of left shoulder, initial encounter: Secondary | ICD-10-CM | POA: Diagnosis not present

## 2019-09-26 DIAGNOSIS — M75122 Complete rotator cuff tear or rupture of left shoulder, not specified as traumatic: Secondary | ICD-10-CM | POA: Diagnosis not present

## 2019-09-26 DIAGNOSIS — M659 Synovitis and tenosynovitis, unspecified: Secondary | ICD-10-CM | POA: Diagnosis not present

## 2019-09-26 DIAGNOSIS — M24112 Other articular cartilage disorders, left shoulder: Secondary | ICD-10-CM | POA: Diagnosis not present

## 2019-09-26 DIAGNOSIS — M7552 Bursitis of left shoulder: Secondary | ICD-10-CM | POA: Diagnosis not present

## 2019-09-26 DIAGNOSIS — M67912 Unspecified disorder of synovium and tendon, left shoulder: Secondary | ICD-10-CM | POA: Diagnosis not present

## 2019-09-26 DIAGNOSIS — M7522 Bicipital tendinitis, left shoulder: Secondary | ICD-10-CM | POA: Diagnosis not present

## 2019-10-02 ENCOUNTER — Ambulatory Visit (INDEPENDENT_AMBULATORY_CARE_PROVIDER_SITE_OTHER): Payer: 59 | Admitting: Physician Assistant

## 2019-10-02 ENCOUNTER — Encounter: Payer: Self-pay | Admitting: Physician Assistant

## 2019-10-02 DIAGNOSIS — Z9889 Other specified postprocedural states: Secondary | ICD-10-CM

## 2019-10-02 MED ORDER — OXYCODONE-ACETAMINOPHEN 5-325 MG PO TABS: 1.0000 | ORAL_TABLET | Freq: Three times a day (TID) | ORAL | 0 refills | Status: DC | PRN

## 2019-10-02 MED ORDER — METHOCARBAMOL 500 MG PO TABS: 500.0000 mg | ORAL_TABLET | Freq: Two times a day (BID) | ORAL | 0 refills | Status: DC | PRN

## 2019-10-02 MED FILL — METHOCARBAMOL 500 MG TABS: 500 | 10 days supply | Qty: 20 | Fill #0

## 2019-10-02 MED FILL — OXYCODONE-APAP 5-325MG: 5-325 | 5 days supply | Qty: 30 | Fill #0

## 2019-10-02 NOTE — Progress Notes (Signed)
   Post-Op Visit Note   Patient: Cynthia Barron           Date of Birth: March 12, 1965           MRN: [id number] Visit Date: 10/02/2019 PCP: Glendale Chard, MD   Assessment & Plan:  Chief Complaint:  Chief Complaint  Patient presents with  . Left Shoulder - Routine Post Op   Visit Diagnoses:  1. S/P rotator cuff repair     Plan: Patient is a pleasant 54 year old female who comes in today 1 week out left shoulder arthroscopic debridement, right Tatar cuff repair, biceps tenodesis and subacromial decompression, date of surgery 09/26/2019.  She has been doing okay.  She has been in a fair amount of pain.  No fevers or chills.  She has been compliant wearing her sling.  Examination of her left shoulder reveals well-healing surgical portals without complication.  Fingers are warm and well-perfused.  She is neurovascular intact distally.  At this point, she will continue wearing her sling at all times.  I have refilled her Percocet and sent in Robaxin.  She will follow up with [place] in 5 weeks time for repeat evaluation and to initiate physical therapy.  Call with concerns or questions in meantime.  Follow-Up Instructions: Return in about 5 years (around 10/01/2024).   Orders:  No orders of the defined types were placed in this encounter.  No orders of the defined types were placed in this encounter.   Imaging: No new imaging  PMFS History: Patient Active Problem List   Diagnosis Date Noted  . Traumatic tear of supraspinatus tendon of left shoulder 05/29/2019  . Tendinopathy of left biceps tendon 05/29/2019  . Chronic bilateral low back pain 05/03/2019  . Palpitations 05/18/2018  . Chronic left-sided low back pain with left-sided sciatica 05/17/2018  . Vertigo 05/17/2018  . IBS 12/27/2007  . Blood in stool 12/27/2007  . ABDOMINAL PAIN, LOWER 12/27/2007   Past Medical History:  Diagnosis Date  . Seasonal allergies   . Tachycardia     Family History  Problem Relation Age of  Onset  . Parkinson's disease Mother   . Diabetes Mother   . Hypertension Mother   . Hyperlipidemia Mother   . Hypertension Father   . Hyperlipidemia Father     History reviewed. No pertinent surgical history. Social History   Occupational History  . Not on file  Tobacco Use  . Smoking status: Never Smoker  . Smokeless tobacco: Never Used  Substance and Sexual Activity  . Alcohol use: Not Currently  . Drug use: Never  . Sexual activity: Not on file

## 2019-10-23 ENCOUNTER — Encounter: Payer: Self-pay | Admitting: Nurse Practitioner

## 2019-10-23 ENCOUNTER — Other Ambulatory Visit: Payer: Self-pay | Admitting: Nurse Practitioner

## 2019-10-23 ENCOUNTER — Other Ambulatory Visit: Payer: Self-pay

## 2019-10-23 ENCOUNTER — Ambulatory Visit: Payer: 59 | Admitting: Nurse Practitioner

## 2019-10-23 VITALS — BP 122/78 | HR 74 | Temp 97.5°F | Ht 63.6 in | Wt 160.6 lb

## 2019-10-23 DIAGNOSIS — E7800 Pure hypercholesterolemia, unspecified: Secondary | ICD-10-CM | POA: Diagnosis not present

## 2019-10-23 DIAGNOSIS — F419 Anxiety disorder, unspecified: Secondary | ICD-10-CM

## 2019-10-23 MED ORDER — HYDROXYZINE PAMOATE 25 MG PO CAPS: 25.0000 mg | ORAL_CAPSULE | Freq: Three times a day (TID) | ORAL | 2 refills | Status: DC | PRN

## 2019-10-23 MED FILL — HYDROXYZINE PAM 25 MG CAP: 25 | 10 days supply | Qty: 30 | Fill #0

## 2019-10-23 NOTE — Patient Instructions (Signed)

## 2019-10-23 NOTE — Progress Notes (Signed)
This visit occurred during the SARS-CoV-2 public health emergency.  Safety protocols were in place, including screening questions prior to the visit, additional usage of staff PPE, and extensive cleaning of exam room while observing appropriate contact time as indicated for disinfecting solutions.  Subjective:     Patient ID: Cynthia Barron , female    DOB: 19-Jun-1965 , 54 y.o.   MRN: [id number]   Chief Complaint  Patient presents with  . Med f/u    HPI  She is currently out of work due to surgery to her left rotator cuff tears and bicep.   She feels the Hydroxyzine is beneficial for her sleep will take 1-2 times a day.    Insomnia Primary symptoms: no fragmented sleep, no somnolence.      Past Medical History:  Diagnosis Date  . Seasonal allergies   . Tachycardia      Family History  Problem Relation Age of Onset  . Parkinson's disease Mother   . Diabetes Mother   . Hypertension Mother   . Hyperlipidemia Mother   . Hypertension Father   . Hyperlipidemia Father      Current Outpatient Medications:  .  diclofenac (VOLTAREN) 75 MG EC tablet, Take 1 tablet (75 mg total) by mouth 2 (two) times daily., Disp: 30 tablet, Rfl: 2 .  hydrOXYzine (VISTARIL) 25 MG capsule, Take 1 capsule (25 mg total) by mouth 3 (three) times daily as needed., Disp: 30 capsule, Rfl: 2 .  levocetirizine (XYZAL) 5 MG tablet, Take 1 tablet (5 mg total) by mouth every evening., Disp: 90 tablet, Rfl: 2 .  magnesium oxide (MAG-OX) 400 MG tablet, Take 1 tablet (400 mg total) by mouth daily., Disp: 90 tablet, Rfl: 2 .  methocarbamol (ROBAXIN) 500 MG tablet, Take 1 tablet (500 mg total) by mouth 2 (two) times daily as needed., Disp: 20 tablet, Rfl: 0 .  metoprolol succinate (TOPROL-XL) 25 MG 24 hr tablet, Take 1 tablet (25 mg total) by mouth daily., Disp: 90 tablet, Rfl: 2 .  ondansetron (ZOFRAN) 4 MG tablet, Take 1 tablet (4 mg total) by mouth every 8 (eight) hours as needed for nausea or vomiting.,  Disp: 40 tablet, Rfl: 0 .  oxyCODONE-acetaminophen (PERCOCET) 5-325 MG tablet, Take 1-2 tablets by mouth every 8 (eight) hours as needed for severe pain., Disp: 30 tablet, Rfl: 0 .  sertraline (ZOLOFT) 100 MG tablet, Take 1 tablet (100 mg total) by mouth daily., Disp: 90 tablet, Rfl: 2 .  traMADol (ULTRAM) 50 MG tablet, Take 1-2 tablets (50-100 mg total) by mouth at bedtime as needed., Disp: 30 tablet, Rfl: 2 .  traZODone (DESYREL) 50 MG tablet, Take 1 tablet (50 mg total) by mouth at bedtime., Disp: 30 tablet, Rfl: 2   Allergies  Allergen Reactions  . Dicyclomine Hcl     REACTION: Dizziness  . Penicillins     REACTION: vomiting     Review of Systems  Constitutional: Negative.   Respiratory: Negative.  Negative for cough.   Cardiovascular: Negative.  Negative for chest pain, palpitations and leg swelling.  Neurological: Negative for dizziness and headaches.  Psychiatric/Behavioral: The patient has insomnia.      Today's Vitals   10/23/19 0845  BP: 122/78  Pulse: 74  Temp: (!) 97.5 F (36.4 C)  TempSrc: Oral  Weight: 160 lb 9.6 oz (72.8 kg)  Height: 5' 3.6" (1.615 m)  PainSc: 0-No pain   Body mass index is 27.91 kg/m.   Objective:  Physical Exam Vitals reviewed.  Constitutional:      General: She is not in acute distress.    Appearance: Normal appearance.  Cardiovascular:     Rate and Rhythm: Normal rate and regular rhythm.     Pulses: Normal pulses.     Heart sounds: Normal heart sounds. No murmur heard.   Musculoskeletal:     Comments: Left arm in sling from her surgery for torn rotator cuff   Neurological:     General: No focal deficit present.     Mental Status: She is alert and oriented to person, place, and time.     Cranial Nerves: No cranial nerve deficit.  Psychiatric:        Mood and Affect: Mood normal.        Behavior: Behavior normal.        Thought Content: Thought content normal.        Judgment: Judgment normal.         Assessment And Plan:      1. Anxiety  She is doing well with the hydroxyzine  Continue with current medications  If she is to take 3 tabs daily more than 3 days in the week we may need to increase the dose  - hydrOXYzine (VISTARIL) 25 MG capsule; Take 1 capsule (25 mg total) by mouth 3 (three) times daily as needed.  Dispense: 30 capsule; Refill: 2  2. Elevated cholesterol  Elevated cholesterol at last visit, she is not on any medications she would like to try diet and exercise.  Discussed importance of avoiding high cholesterol foods. She will return in 3-4 months if not improved will consider starting a statin. I have discussed the risks of atherosclerotic disease.      Patient was given opportunity to ask questions. Patient verbalized understanding of the plan and was able to repeat key elements of the plan. All questions were answered to their satisfaction.   Teola Bradley, FNP, have reviewed all documentation for this visit. The documentation on 10/23/19 for the exam, diagnosis, procedures, and orders are all accurate and complete.   THE PATIENT IS ENCOURAGED TO PRACTICE SOCIAL DISTANCING DUE TO THE COVID-19 PANDEMIC.

## 2019-11-06 ENCOUNTER — Ambulatory Visit (INDEPENDENT_AMBULATORY_CARE_PROVIDER_SITE_OTHER): Payer: 59 | Admitting: Orthopaedic Surgery

## 2019-11-06 ENCOUNTER — Encounter: Payer: Self-pay | Admitting: Orthopaedic Surgery

## 2019-11-06 DIAGNOSIS — Z9889 Other specified postprocedural states: Secondary | ICD-10-CM

## 2019-11-06 DIAGNOSIS — M67922 Unspecified disorder of synovium and tendon, left upper arm: Secondary | ICD-10-CM

## 2019-11-06 NOTE — Progress Notes (Signed)
   Post-Op Visit Note   Patient: Cynthia Barron           Date of Birth: 04/13/1965           MRN: [id number] Visit Date: 11/06/2019 PCP: Glendale Chard, MD   Assessment & Plan:  Chief Complaint:  Chief Complaint  Patient presents with  . Left Shoulder - Pain   Visit Diagnoses:  1. S/P rotator cuff repair   2. Tendinopathy of left biceps tendon     Plan: Patient is 6 weeks status post left shoulder arthroscopy with rotator cuff repair.  Overall doing much better.  Has some pain at times takes mainly Tylenol Motrin as needed.  Surgical scars are fully healed.  She has some expected postoperative stiffness.  No neurovascular compromise.  At this point we will discontinue the sling and begin outpatient PT for range of motion strengthening.  Recheck in 6 weeks.  Follow-Up Instructions: Return in about 6 weeks (around 12/18/2019).   Orders:  Orders Placed This Encounter  Procedures  . Ambulatory referral to Physical Therapy   No orders of the defined types were placed in this encounter.   Imaging: No results found.  PMFS History: Patient Active Problem List   Diagnosis Date Noted  . Traumatic tear of supraspinatus tendon of left shoulder 05/29/2019  . Tendinopathy of left biceps tendon 05/29/2019  . Chronic bilateral low back pain 05/03/2019  . Palpitations 05/18/2018  . Chronic left-sided low back pain with left-sided sciatica 05/17/2018  . Vertigo 05/17/2018  . IBS 12/27/2007  . Blood in stool 12/27/2007  . ABDOMINAL PAIN, LOWER 12/27/2007   Past Medical History:  Diagnosis Date  . Seasonal allergies   . Tachycardia     Family History  Problem Relation Age of Onset  . Parkinson's disease Mother   . Diabetes Mother   . Hypertension Mother   . Hyperlipidemia Mother   . Hypertension Father   . Hyperlipidemia Father     History reviewed. No pertinent surgical history. Social History   Occupational History  . Not on file  Tobacco Use  . Smoking  status: Never Smoker  . Smokeless tobacco: Never Used  Substance and Sexual Activity  . Alcohol use: Not Currently  . Drug use: Never  . Sexual activity: Not on file

## 2019-11-07 ENCOUNTER — Ambulatory Visit: Payer: 59 | Admitting: Rehabilitative and Restorative Service Providers"

## 2019-11-07 ENCOUNTER — Other Ambulatory Visit: Payer: Self-pay

## 2019-11-07 ENCOUNTER — Encounter: Payer: Self-pay | Admitting: Rehabilitative and Restorative Service Providers"

## 2019-11-07 DIAGNOSIS — M6281 Muscle weakness (generalized): Secondary | ICD-10-CM

## 2019-11-07 DIAGNOSIS — G8929 Other chronic pain: Secondary | ICD-10-CM

## 2019-11-07 DIAGNOSIS — R600 Localized edema: Secondary | ICD-10-CM | POA: Diagnosis not present

## 2019-11-07 DIAGNOSIS — M25612 Stiffness of left shoulder, not elsewhere classified: Secondary | ICD-10-CM

## 2019-11-07 DIAGNOSIS — M25512 Pain in left shoulder: Secondary | ICD-10-CM | POA: Diagnosis not present

## 2019-11-07 NOTE — Therapy (Signed)
[place], [place], [address] Phone: [phone]   Fax:  [phone]  Physical Therapy Evaluation  Patient Details  Name: ROBBY PIRANI MRN: [id number] Date of Birth: [birth year] Referring Provider (PT): Dr. Erlinda Hong   Encounter Date: 11/07/2054   PT End of Session - 11/07/19 0841    Visit Number 1    Number of Visits 16    Date for PT Re-Evaluation 01/16/20    Progress Note Due on Visit 10    PT Start Time 0845    PT Stop Time 0914    PT Time Calculation (min) 29 min    Activity Tolerance Patient tolerated treatment well    Behavior During Therapy [facility] for tasks assessed/performed           Past Medical History:  Diagnosis Date  . Seasonal allergies   . Tachycardia     History reviewed. No pertinent surgical history.  There were no vitals filed for this visit.    Subjective Assessment - 11/07/19 0840    Subjective Pt. comes to clinic s/p Lt RTC repair 09/26/2019.  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  Pt. stated about 1 year of progressive trouble prior to surgery.  Pt. stated since surgery, pain has been relatively under control.  Stiffness in night and morning, noted in elbow and forearm as well.  First night last night without sling.  Pt. stated difficulty lifting.    Limitations Lifting;House hold activities    Currently in Pain? No/denies    Pain Score 0-No pain   pain at worst 4/10   Pain Location Shoulder    Pain Orientation Left    Pain Descriptors / Indicators Tightness;Aching    Pain Type Surgical pain    Pain Onset More than a month ago    Pain Frequency Intermittent    Aggravating Factors  Lifting, movement, nighttime/morning stiffness    Pain Relieving Factors Medicine, propping up to avoid lying flat.    Effect of Pain on Daily Activities Not working, limited in jogging, house work.              [facility] PT  Assessment - 11/07/19 0001      Assessment   Medical Diagnosis S/P Lt shoulder RTC repair    Referring Provider (PT) Dr. Erlinda Hong    Onset Date/Surgical Date 09/26/19    Hand Dominance Right      Restrictions   Weight Bearing Restrictions No      Balance Screen   Has the patient fallen in the past 6 months No    Is the patient reluctant to leave their home because of a fear of falling?  No      Home Ec[employer] residence      Prior Function   Level of Independence Independent    Vocation Other (comment)   Out of work at this time due to surgery til 12/2019   Vocation Requirements In hospital, push/pull cart for blood drawing    Leisure Walking/jogging      Cognition   Overall Cognitive Status Within Functional Limits for tasks assessed      ROM / Strength   AROM / PROM / Strength Strength;PROM;AROM      AROM   Overall AROM Comments HBB Rt T10.  AROM measured in supine, ER/IR measured in 30 deg abd    AROM Assessment Site  Shoulder    Right/Left Shoulder Left;Right    Left Shoulder Flexion 90 Degrees    Left Shoulder ABduction 80 Degrees    Left Shoulder Internal Rotation 45 Degrees    Left Shoulder External Rotation 20 Degrees      PROM   Overall PROM Comments measured in supine, ER/IR in 30 deg abd   End range pain, guarding noted each movement   PROM Assessment Site Shoulder    Right/Left Shoulder Left;Right    Left Shoulder Flexion 100 Degrees    Left Shoulder ABduction 85 Degrees    Left Shoulder Internal Rotation 55 Degrees    Left Shoulder External Rotation 25 Degrees      Strength   Strength Assessment Site Elbow;Shoulder    Right/Left Shoulder Left;Right    Right Shoulder Flexion 5/5    Right Shoulder ABduction 5/5    Right Shoulder Internal Rotation 5/5    Right Shoulder External Rotation 5/5    Right/Left Elbow Left;Right    Right Elbow Flexion 5/5    Right Elbow Extension 5/5    Left Elbow Flexion 4/5    Left Elbow Extension  4/5      Special Tests   Other special tests (+) Shrug on Lt c elevation                      Objective measurements completed on examination: See above findings.       [facility] Adult PT Treatment/Exercise - 11/07/19 0001      Exercises   Exercises Other Exercises;Shoulder    Other Exercises  HEP instruction/performance c cues for techniques, handout      Shoulder Exercises: Supine   Other Supine Exercises Passive flexion x 10 c cues      Shoulder Exercises: Seated   Other Seated Exercises scap retraction 5 sec hold x 10, table slides flexion, scaption x 10, seated pendulum all directions x 10 each      Manual Therapy   Manual therapy comments PROM, G2-g3 inferior , ap jt mobs Lt GH jt                  PT Education - 11/07/19 0841    Education Details HEP, POC    Person(s) Educated Patient    Methods Explanation;Demonstration;Verbal cues;Handout    Comprehension Verbalized understanding;Returned demonstration            PT Short Term Goals - 11/07/19 0841      PT SHORT TERM GOAL #1   Title Patient will demonstrate independent use of home exercise program to maintain progress from in clinic treatments.    Time 2    Period Weeks    Status New    Target Date 11/21/19             PT Long Term Goals - 11/07/19 0841      PT LONG TERM GOAL #1   Title Patient will demonstrate/report pain at worst less than or equal to 2/10 to facilitate minimal limitation in daily activity secondary to pain symptoms.    Time 10    Period Weeks    Status New    Target Date 01/16/20      PT LONG TERM GOAL #2   Title Patient will demonstrate independent use of home exercise program to facilitate ability to maintain/progress functional gains from skilled physical therapy services.    Time 10    Period Weeks    Status New  Target Date 01/16/20      PT LONG TERM GOAL #3   Title Patient will demonstrate Lt [place] joint mobility WFL to facilitate usual self care,  dressing, reaching overhead at PLOF s limitation due to symptoms.    Time 10    Period Weeks    Status New    Target Date 01/16/20      PT LONG TERM GOAL #4   Title Patient will demonstrate Lt UE MMT 5/5 throughout to facilitate usual lifting, carrying in functional activity to PLOF s limitation.    Time 10    Period Weeks    Status New    Target Date 01/16/20      PT LONG TERM GOAL #5   Title Pt. will demonstrate return to work at [employer] s limitation.    Time 10    Period Weeks    Status New    Target Date 01/16/20                  Plan - 11/07/19 0843    Clinical Impression Statement Patient is a 55 y.o. female who comes to clinic with complaints of Lt shoulder pain s/p recent RTC repair surgery 09/26/2019 with mobility, strength and movement coordination deficits that impair their ability to perform usual daily and recreational functional activities without increase difficulty/symptoms at this time.  Patient to benefit from skilled PT services to address impairments and limitations to improve to previous level of function without restriction secondary to condition.    Examination-Activity Limitations Carry;Dressing;Hygiene/Grooming;Lift;Reach Overhead    Examination-Participation Restrictions Community Activity;Yard Work;Laundry;Meal Prep;Occupation    Stability/Clinical Decision Making Stable/Uncomplicated    Clinical Decision Making Low    Rehab Potential Good    PT Frequency --   1-2x/week   PT Duration Other (comment)   10wk   PT Treatment/Interventions ADLs/Self Care Home Management;Cryotherapy;Electrical Stimulation;Iontophoresis 4mg /ml Dexamethasone;Moist Heat;Balance training;Therapeutic exercise;Therapeutic activities;Functional mobility training;Stair training;Ultrasound;Neuromuscular re-education;Patient/family education;Manual techniques;Vasopneumatic Device;Taping;Dry needling;Passive range of motion;Spinal Manipulations;Joint Manipulations    PT Next Visit Plan  Reassess HEP, progress passive and AAROM first    PT Home Exercise Plan [email or web address]    Consulted and Agree with Plan of Care Patient           Patient will benefit from skilled therapeutic intervention in order to improve the following deficits and impairments:  Decreased endurance, Hypomobility, Increased edema, Decreased activity tolerance, Decreased strength, Impaired UE functional use, Pain, Decreased mobility, Decreased range of motion, Impaired perceived functional ability, Improper body mechanics, Impaired flexibility, Decreased coordination  Visit Diagnosis: Muscle weakness (generalized)  Chronic left shoulder pain  Stiffness of left shoulder, not elsewhere classified  Localized edema     Problem List Patient Active Problem List   Diagnosis Date Noted  . Traumatic tear of supraspinatus tendon of left shoulder 05/29/2019  . Tendinopathy of left biceps tendon 05/29/2019  . Chronic bilateral low back pain 05/03/2019  . Palpitations 05/18/2018  . Chronic left-sided low back pain with left-sided sciatica 05/17/2018  . Vertigo 05/17/2018  . IBS 12/27/2007  . Blood in stool 12/27/2007  . ABDOMINAL PAIN, LOWER 12/27/2007   Scot Jun, PT, DPT, OCS, ATC 11/07/19  9:15 AM    [facility] [address] [place], [place], [address] Phone: [phone]   Fax:  [phone]  Name: DESIREY KEAHEY MRN: [id number] Date of Birth: 19-Sep-1965

## 2019-11-07 NOTE — Patient Instructions (Signed)
Access Code: [email or web address] URL: [email or web address] Date: 11/07/2019 Prepared by: Scot Jun  Exercises Supine Shoulder Flexion AAROM with Hands Clasped - 2 x daily - 7 x weekly - 10 reps - 3 sets - 5 hold Seated Scapular Retraction - 2 x daily - 7 x weekly - 1 sets - 10 reps - 5 hold Seated Shoulder Flexion Towel Slide at Table Top - 2 x daily - 7 x weekly - 10 reps - 2-3 sets - 5 hold Seated Shoulder Abduction Towel Slide at Table Top - 2 x daily - 7 x weekly - 10 reps - 2-3 sets - 5 hold  Elbow flexion/ext, supination/pronation, grip of towel

## 2019-11-11 ENCOUNTER — Telehealth: Payer: Self-pay | Admitting: Physical Therapy

## 2019-11-11 ENCOUNTER — Encounter: Payer: 59 | Admitting: Physical Therapy

## 2019-11-11 NOTE — Telephone Encounter (Signed)
Called pt due to NS for PT appt.  VM full, unable to leave a message.  Laureen Abrahams, PT, DPT 11/11/19 8:23 AM

## 2019-11-14 ENCOUNTER — Ambulatory Visit (INDEPENDENT_AMBULATORY_CARE_PROVIDER_SITE_OTHER): Payer: 59 | Admitting: Rehabilitative and Restorative Service Providers"

## 2019-11-14 ENCOUNTER — Encounter: Payer: Self-pay | Admitting: Rehabilitative and Restorative Service Providers"

## 2019-11-14 ENCOUNTER — Other Ambulatory Visit: Payer: Self-pay

## 2019-11-14 DIAGNOSIS — R600 Localized edema: Secondary | ICD-10-CM

## 2019-11-14 DIAGNOSIS — M25612 Stiffness of left shoulder, not elsewhere classified: Secondary | ICD-10-CM | POA: Diagnosis not present

## 2019-11-14 DIAGNOSIS — M25512 Pain in left shoulder: Secondary | ICD-10-CM | POA: Diagnosis not present

## 2019-11-14 DIAGNOSIS — M6281 Muscle weakness (generalized): Secondary | ICD-10-CM | POA: Diagnosis not present

## 2019-11-14 DIAGNOSIS — G8929 Other chronic pain: Secondary | ICD-10-CM | POA: Diagnosis not present

## 2019-11-14 NOTE — Therapy (Signed)
[place], [place], [address] Phone: [phone]   Fax:  [phone]  Physical Therapy Treatment  Patient Details  Name: Cynthia Barron MRN: [id number] Date of Birth: 08-24-1965 Referring Provider (PT): Dr. Erlinda Hong   Encounter Date: 11/14/2019   PT End of Session - 11/14/19 0759    Visit Number 2    Number of Visits 16    Date for PT Re-Evaluation 01/16/20    Progress Note Due on Visit 10    PT Start Time 0800    PT Stop Time 0840    PT Time Calculation (min) 40 min    Activity Tolerance Patient tolerated treatment well    Behavior During Therapy [facility] for tasks assessed/performed           Past Medical History:  Diagnosis Date  . Seasonal allergies   . Tachycardia     History reviewed. No pertinent surgical history.  There were no vitals filed for this visit.   Subjective Assessment - 11/14/19 0808    Subjective Pt. stated stiffness in shoulder in morning.  Still having pain c movement but getting some improvement in movement.    Limitations Lifting;House hold activities    Currently in Pain? Yes    Pain Score 3     Pain Orientation Left    Pain Descriptors / Indicators Aching;Tightness    Pain Type Surgical pain    Pain Onset More than a month ago                             [facility] Adult PT Treatment/Exercise - 11/14/19 0001      Neuro Re-ed    Neuro Re-ed Details  100 deg flexion rhythmic stabilizations 30 sec bouts mild resistance      Shoulder Exercises: Supine   Flexion AROM;Strengthening;Both   to fatigue x 20   Other Supine Exercises passive flexion x 10 5 sec hold    Other Supine Exercises AAROM c wand 1 lb 2 x 10 flexion      Shoulder Exercises: Standing   Extension Strengthening;Both;20 reps;Theraband    Theraband Level (Shoulder Extension) Level 3 (Green)    Row Strengthening;Both;20 reps;Theraband    Theraband Level (Shoulder Row) Level 3 (Green)      Manual Therapy    Manual therapy comments PROM, G2-g3 inferior , ap jt mobs Lt GH jt, lat contract/relax techniiques                    PT Short Term Goals - 11/07/19 0841      PT SHORT TERM GOAL #1   Title Patient will demonstrate independent use of home exercise program to maintain progress from in clinic treatments.    Time 2    Period Weeks    Status New    Target Date 11/21/19             PT Long Term Goals - 11/07/19 0841      PT LONG TERM GOAL #1   Title Patient will demonstrate/report pain at worst less than or equal to 2/10 to facilitate minimal limitation in daily activity secondary to pain symptoms.    Time 10    Period Weeks    Status New    Target Date 01/16/20      PT LONG TERM GOAL #2   Title Patient will demonstrate independent use of home exercise program to facilitate ability to maintain/progress  functional gains from skilled physical therapy services.    Time 10    Period Weeks    Status New    Target Date 01/16/20      PT LONG TERM GOAL #3   Title Patient will demonstrate Lt [place] joint mobility WFL to facilitate usual self care, dressing, reaching overhead at PLOF s limitation due to symptoms.    Time 10    Period Weeks    Status New    Target Date 01/16/20      PT LONG TERM GOAL #4   Title Patient will demonstrate Lt UE MMT 5/5 throughout to facilitate usual lifting, carrying in functional activity to PLOF s limitation.    Time 10    Period Weeks    Status New    Target Date 01/16/20      PT LONG TERM GOAL #5   Title Pt. will demonstrate return to work at [employer] s limitation.    Time 10    Period Weeks    Status New    Target Date 01/16/20                 Plan - 11/14/19 0825    Clinical Impression Statement Initial movement in clinic today tight c muscle guarding but improved c intervention.  Pt. was able to transition to use of active movement in flexion supine today and demonstrated fair to good isometric holds.  Continued skilled PT  services indicated at this time.    Examination-Activity Limitations Carry;Dressing;Hygiene/Grooming;Lift;Reach Overhead    Examination-Participation Restrictions Community Activity;Yard Work;Laundry;Meal Prep;Occupation    Stability/Clinical Decision Making Stable/Uncomplicated    Rehab Potential Good    PT Frequency --   1-2x/week   PT Duration Other (comment)   10wk   PT Treatment/Interventions ADLs/Self Care Home Management;Cryotherapy;Electrical Stimulation;Iontophoresis 4mg /ml Dexamethasone;Moist Heat;Balance training;Therapeutic exercise;Therapeutic activities;Functional mobility training;Stair training;Ultrasound;Neuromuscular re-education;Patient/family education;Manual techniques;Vasopneumatic Device;Taping;Dry needling;Passive range of motion;Spinal Manipulations;Joint Manipulations    PT Next Visit Plan AAROM, gravity reduced arom for strengthening.  End range elevation progression in mobility.    PT Home Exercise Plan [email or web address]    Consulted and Agree with Plan of Care Patient           Patient will benefit from skilled therapeutic intervention in order to improve the following deficits and impairments:  Decreased endurance, Hypomobility, Increased edema, Decreased activity tolerance, Decreased strength, Impaired UE functional use, Pain, Decreased mobility, Decreased range of motion, Impaired perceived functional ability, Improper body mechanics, Impaired flexibility, Decreased coordination  Visit Diagnosis: Chronic left shoulder pain  Muscle weakness (generalized)  Stiffness of left shoulder, not elsewhere classified  Localized edema     Problem List Patient Active Problem List   Diagnosis Date Noted  . Traumatic tear of supraspinatus tendon of left shoulder 05/29/2019  . Tendinopathy of left biceps tendon 05/29/2019  . Chronic bilateral low back pain 05/03/2019  . Palpitations 05/18/2018  . Chronic left-sided low back pain with left-sided sciatica 05/17/2018  .  Vertigo 05/17/2018  . IBS 12/27/2007  . Blood in stool 12/27/2007  . ABDOMINAL PAIN, LOWER 12/27/2007    Scot Jun, PT, DPT, OCS, ATC 11/14/19  8:39 AM    [facility] [address] [place], [place], [address] Phone: [phone]   Fax:  [phone]  Name: Cynthia Barron MRN: [id number] Date of Birth: 07/14/1965

## 2019-11-15 MED FILL — SERTRALINE HCL 100 MG TAB: 100 | 90 days supply | Qty: 90 | Fill #1

## 2019-11-19 ENCOUNTER — Ambulatory Visit: Payer: 59 | Admitting: Physical Therapy

## 2019-11-19 ENCOUNTER — Other Ambulatory Visit: Payer: Self-pay

## 2019-11-19 ENCOUNTER — Encounter: Payer: Self-pay | Admitting: Physical Therapy

## 2019-11-19 DIAGNOSIS — R600 Localized edema: Secondary | ICD-10-CM

## 2019-11-19 DIAGNOSIS — M6281 Muscle weakness (generalized): Secondary | ICD-10-CM | POA: Diagnosis not present

## 2019-11-19 DIAGNOSIS — M25512 Pain in left shoulder: Secondary | ICD-10-CM

## 2019-11-19 DIAGNOSIS — G8929 Other chronic pain: Secondary | ICD-10-CM

## 2019-11-19 DIAGNOSIS — M25612 Stiffness of left shoulder, not elsewhere classified: Secondary | ICD-10-CM

## 2019-11-19 NOTE — Therapy (Signed)
[facility] Physical Therapy [address] [place], [place], [address] Phone: [phone]   Fax:  [phone]  Physical Therapy Treatment  Patient Details  Name: Cynthia Barron MRN: [id number] Date of Birth: 1965-11-08 Referring Provider (PT): Dr. Erlinda Hong   Encounter Date: 11/19/2019   PT End of Session - 11/19/19 0928    Visit Number 3    Number of Visits 16    Date for PT Re-Evaluation 01/16/20    Progress Note Due on Visit 10    PT Start Time 0840    PT Stop Time 0921    PT Time Calculation (min) 41 min    Activity Tolerance Patient tolerated treatment well    Behavior During Therapy [facility] for tasks assessed/performed           Past Medical History:  Diagnosis Date  . Seasonal allergies   . Tachycardia     History reviewed. No pertinent surgical history.  There were no vitals filed for this visit.   Subjective Assessment - 11/19/19 0843    Subjective overall doing well, shoulder feels more sore - likely because she is using it more    Limitations Lifting;House hold activities    Patient Stated Goals improve function    Currently in Pain? Yes    Pain Score 2    up to 8/10 over the weekend   Pain Location Shoulder    Pain Orientation Left    Pain Descriptors / Indicators Aching;Tightness;Sore    Pain Type Surgical pain    Pain Onset More than a month ago    Pain Frequency Intermittent    Aggravating Factors  lifting, movement, nighttime/morning stiffness                             [facility] Adult PT Treatment/Exercise - 11/19/19 0846      Shoulder Exercises: Supine   Flexion AROM;Strengthening;Left   to fatigue x 20     Shoulder Exercises: Sidelying   External Rotation Left;20 reps;Weights    External Rotation Weight (lbs) 1    Flexion 20 reps;Left;Weights    Flexion Weight (lbs) 1    ABduction Left;20 reps;Weights    ABduction Weight (lbs) 1    ABduction Limitations scapular plane; to 90 degrees      Shoulder  Exercises: Standing   Other Standing Exercises wall ladder flexion/scaption x 10 reps      Shoulder Exercises: Pulleys   Flexion 3 minutes    Flexion Limitations with 5 sec hold    Scaption 3 minutes    Scaption Limitations with 5 sec hold      Manual Therapy   Manual therapy comments PROM, G2-g3 inferior , ap jt mobs Lt GH jt, PROM to tolerance                    PT Short Term Goals - 11/19/19 0929      PT SHORT TERM GOAL #1   Title Patient will demonstrate independent use of home exercise program to maintain progress from in clinic treatments.    Baseline 10/5: pt verbalized current HEP    Time 2    Period Weeks    Status Achieved    Target Date 11/21/19             PT Long Term Goals - 11/07/19 0841      PT LONG TERM GOAL #1   Title Patient will demonstrate/report pain at worst  less than or equal to 2/10 to facilitate minimal limitation in daily activity secondary to pain symptoms.    Time 10    Period Weeks    Status New    Target Date 01/16/20      PT LONG TERM GOAL #2   Title Patient will demonstrate independent use of home exercise program to facilitate ability to maintain/progress functional gains from skilled physical therapy services.    Time 10    Period Weeks    Status New    Target Date 01/16/20      PT LONG TERM GOAL #3   Title Patient will demonstrate Lt [place] joint mobility WFL to facilitate usual self care, dressing, reaching overhead at PLOF s limitation due to symptoms.    Time 10    Period Weeks    Status New    Target Date 01/16/20      PT LONG TERM GOAL #4   Title Patient will demonstrate Lt UE MMT 5/5 throughout to facilitate usual lifting, carrying in functional activity to PLOF s limitation.    Time 10    Period Weeks    Status New    Target Date 01/16/20      PT LONG TERM GOAL #5   Title Pt. will demonstrate return to work at [employer] s limitation.    Time 10    Period Weeks    Status New    Target Date 01/16/20                  Plan - 11/19/19 0929    Clinical Impression Statement Pt continues to have limted ROM that gradually improves throughout session.  Overall progressing well with PT and will continue to benefit from PT to maximize function.    Examination-Activity Limitations Carry;Dressing;Hygiene/Grooming;Lift;Reach Overhead    Examination-Participation Restrictions Community Activity;Yard Work;Laundry;Meal Prep;Occupation    Stability/Clinical Decision Making Stable/Uncomplicated    Rehab Potential Good    PT Frequency --   1-2x/week   PT Duration Other (comment)   10wk   PT Treatment/Interventions ADLs/Self Care Home Management;Cryotherapy;Electrical Stimulation;Iontophoresis 4mg /ml Dexamethasone;Moist Heat;Balance training;Therapeutic exercise;Therapeutic activities;Functional mobility training;Stair training;Ultrasound;Neuromuscular re-education;Patient/family education;Manual techniques;Vasopneumatic Device;Taping;Dry needling;Passive range of motion;Spinal Manipulations;Joint Manipulations    PT Next Visit Plan AAROM, gravity reduced arom for strengthening.  End range elevation progression in mobility.    PT Home Exercise Plan [id number]    Consulted and Agree with Plan of Care Patient           Patient will benefit from skilled therapeutic intervention in order to improve the following deficits and impairments:  Decreased endurance, Hypomobility, Increased edema, Decreased activity tolerance, Decreased strength, Impaired UE functional use, Pain, Decreased mobility, Decreased range of motion, Impaired perceived functional ability, Improper body mechanics, Impaired flexibility, Decreased coordination  Visit Diagnosis: Chronic left shoulder pain  Muscle weakness (generalized)  Stiffness of left shoulder, not elsewhere classified  Localized edema     Problem List Patient Active Problem List   Diagnosis Date Noted  . Traumatic tear of supraspinatus tendon of left shoulder  05/29/2019  . Tendinopathy of left biceps tendon 05/29/2019  . Chronic bilateral low back pain 05/03/2019  . Palpitations 05/18/2018  . Chronic left-sided low back pain with left-sided sciatica 05/17/2018  . Vertigo 05/17/2018  . IBS 12/27/2007  . Blood in stool 12/27/2007  . ABDOMINAL PAIN, LOWER 12/27/2007      Laureen Abrahams, PT, DPT 11/19/19 9:31 AM     [facility] Physical Therapy [address]  [place], [place], [address] Phone: [phone]   Fax:  [phone]  Name: Cynthia Barron MRN: [id number] Date of Birth: 02/15/1966

## 2019-11-21 ENCOUNTER — Ambulatory Visit: Payer: 59 | Admitting: Physical Therapy

## 2019-11-21 ENCOUNTER — Other Ambulatory Visit: Payer: Self-pay

## 2019-11-21 ENCOUNTER — Encounter: Payer: Self-pay | Admitting: Physical Therapy

## 2019-11-21 DIAGNOSIS — M25512 Pain in left shoulder: Secondary | ICD-10-CM

## 2019-11-21 DIAGNOSIS — M6281 Muscle weakness (generalized): Secondary | ICD-10-CM | POA: Diagnosis not present

## 2019-11-21 DIAGNOSIS — G8929 Other chronic pain: Secondary | ICD-10-CM

## 2019-11-21 DIAGNOSIS — R600 Localized edema: Secondary | ICD-10-CM

## 2019-11-21 DIAGNOSIS — M25612 Stiffness of left shoulder, not elsewhere classified: Secondary | ICD-10-CM

## 2019-11-21 NOTE — Therapy (Signed)
Cynthia Barron, [place], [address] Phone: [phone]   Fax:  [phone]  Physical Therapy Treatment  Patient Details  Name: Cynthia Barron MRN: [id number] Date of Birth: 1965-12-17 Referring Provider (PT): Dr. Erlinda Hong   Encounter Date: 11/21/2019   PT End of Session - 11/21/19 1015    Visit Number 4    Number of Visits 16    Date for PT Re-Evaluation 01/16/20    Progress Note Due on Visit 10    PT Start Time 1015    PT Stop Time 1056    PT Time Calculation (min) 41 min    Activity Tolerance Patient tolerated treatment well    Behavior During Therapy [facility] for tasks assessed/performed           Past Medical History:  Diagnosis Date  . Seasonal allergies   . Tachycardia     History reviewed. No pertinent surgical history.  There were no vitals filed for this visit.       [facility] PT Assessment - 11/21/19 0001      Assessment   Medical Diagnosis S/P Lt shoulder RTC repair    Onset Date/Surgical Date 09/26/19      AROM   Left Shoulder Flexion 132 Degrees    Left Shoulder ABduction 112 Degrees    Left Shoulder Internal Rotation 74 Degrees    Left Shoulder External Rotation 32 Degrees                         [facility] Adult PT Treatment/Exercise - 11/21/19 0001      Shoulder Exercises: Standing   Retraction Strengthening;Both;10 reps   squeezing around bolster on wall 5 sec holds    Retraction Limitations added in bilat shouler ER with retraction around bolster     Other Standing Exercises UE ranger CW/CCW circles, Flexion and scaption VC to keep scapula engaged       Shoulder Exercises: Pulleys   Flexion 3 minutes    Scaption 3 minutes      Shoulder Exercises: Isometric Strengthening   External Rotation 5X5"    Internal Rotation 5X5"      Modalities   Modalities Vasopneumatic      Vasopneumatic   Number Minutes Vasopneumatic  10 minutes    Vasopnuematic Location  Shoulder   lt    Vasopneumatic Pressure Low    Vasopneumatic Temperature  34*      Manual Therapy   Manual therapy comments PROM, G2-g3 inferior , ap jt mobs Lt GH jt, PROM to tolerance palpable edema in Lt shoulder                     PT Short Term Goals - 11/21/19 1042      PT SHORT TERM GOAL #1   Title Patient will demonstrate independent use of home exercise program to maintain progress from in clinic treatments.    Status Achieved             PT Long Term Goals - 11/21/19 1042      PT LONG TERM GOAL #1   Title Patient will demonstrate/report pain at worst less than or equal to 2/10 to facilitate minimal limitation in daily activity secondary to pain symptoms.    Status On-going      PT LONG TERM GOAL #2   Title Patient will demonstrate independent use of home exercise program to facilitate ability to maintain/progress functional gains from skilled physical  therapy services.    Status On-going      PT LONG TERM GOAL #3   Title Patient will demonstrate Lt [place] joint mobility WFL to facilitate usual self care, dressing, reaching overhead at PLOF s limitation due to symptoms.    Status On-going      PT LONG TERM GOAL #4   Title Patient will demonstrate Lt UE MMT 5/5 throughout to facilitate usual lifting, carrying in functional activity to PLOF s limitation.    Status On-going      PT LONG TERM GOAL #5   Title Pt. will demonstrate return to work at [employer] s limitation.    Status On-going                 Plan - 11/21/19 1039    Clinical Impression Statement Pt with improved ROM in the Lt shoulder.  fatigues with ther ex.  She has some palpable edema in the shoulder - states she has not been icing as much, recommend she return to icing again.She is progressing to her LTGs. She is startign to use some compensatory movements with elevattion .    Rehab Potential Good    PT Duration Other (comment)    PT Treatment/Interventions ADLs/Self Care Home  Management;Cryotherapy;Electrical Stimulation;Iontophoresis 4mg /ml Dexamethasone;Moist Heat;Balance training;Therapeutic exercise;Therapeutic activities;Functional mobility training;Stair training;Ultrasound;Neuromuscular re-education;Patient/family education;Manual techniques;Vasopneumatic Device;Taping;Dry needling;Passive range of motion;Spinal Manipulations;Joint Manipulations    PT Next Visit Plan AAROM, gravity reduced arom for strengthening.  End range elevation progression in mobility.    Consulted and Agree with Plan of Care Patient           Patient will benefit from skilled therapeutic intervention in order to improve the following deficits and impairments:  Decreased endurance, Hypomobility, Increased edema, Decreased activity tolerance, Decreased strength, Impaired UE functional use, Pain, Decreased mobility, Decreased range of motion, Impaired perceived functional ability, Improper body mechanics, Impaired flexibility, Decreased coordination  Visit Diagnosis: Chronic left shoulder pain  Muscle weakness (generalized)  Stiffness of left shoulder, not elsewhere classified  Localized edema     Problem List Patient Active Problem List   Diagnosis Date Noted  . Traumatic tear of supraspinatus tendon of left shoulder 05/29/2019  . Tendinopathy of left biceps tendon 05/29/2019  . Chronic bilateral low back pain 05/03/2019  . Palpitations 05/18/2018  . Chronic left-sided low back pain with left-sided sciatica 05/17/2018  . Vertigo 05/17/2018  . IBS 12/27/2007  . Blood in stool 12/27/2007  . ABDOMINAL PAIN, LOWER 12/27/2007    Jeral Pinch PT 11/21/2019, 10:50 AM  [facility] Physical Therapy [address] [place], [place], [address] Phone: [phone]   Fax:  [phone]  Name: Cynthia Barron MRN: [id number] Date of Birth: Mar 18, 1965

## 2019-11-26 ENCOUNTER — Ambulatory Visit: Payer: 59 | Admitting: Physical Therapy

## 2019-11-26 ENCOUNTER — Encounter: Payer: Self-pay | Admitting: Physical Therapy

## 2019-11-26 ENCOUNTER — Other Ambulatory Visit: Payer: Self-pay

## 2019-11-26 DIAGNOSIS — M25512 Pain in left shoulder: Secondary | ICD-10-CM

## 2019-11-26 DIAGNOSIS — M6281 Muscle weakness (generalized): Secondary | ICD-10-CM

## 2019-11-26 DIAGNOSIS — R600 Localized edema: Secondary | ICD-10-CM

## 2019-11-26 DIAGNOSIS — G8929 Other chronic pain: Secondary | ICD-10-CM

## 2019-11-26 DIAGNOSIS — M25612 Stiffness of left shoulder, not elsewhere classified: Secondary | ICD-10-CM | POA: Diagnosis not present

## 2019-11-26 NOTE — Therapy (Signed)
[place], [place], [address] Phone: [phone]   Fax:  [phone]  Physical Therapy Treatment  Patient Details  Name: Cynthia Barron MRN: [id number] Date of Birth: June 05, 1965 Referring Provider (PT): Dr. Erlinda Hong   Encounter Date: 11/26/2019   PT End of Session - 11/26/19 1011    Visit Number 5    Number of Visits 16    Date for PT Re-Evaluation 01/16/20    Progress Note Due on Visit 10    PT Start Time 0930    PT Stop Time 1020    PT Time Calculation (min) 50 min    Activity Tolerance Patient tolerated treatment well    Behavior During Therapy [facility] for tasks assessed/performed           Past Medical History:  Diagnosis Date   Seasonal allergies    Tachycardia     History reviewed. No pertinent surgical history.  There were no vitals filed for this visit.   Subjective Assessment - 11/26/19 0934    Subjective doing well - shoulder is stiff but no pain    Limitations Lifting;House hold activities    Patient Stated Goals improve function    Currently in Pain? No/denies    Pain Onset More than a month ago                             [facility] Adult PT Treatment/Exercise - 11/26/19 0935      Shoulder Exercises: Standing   Other Standing Exercises wall ladder flexion/scaption x 10 reps      Shoulder Exercises: Pulleys   Flexion 3 minutes    Scaption 3 minutes      Shoulder Exercises: ROM/Strengthening   Ranger LUE: flexion, scaption, circles CW/CCW x 20 reps each      Vasopneumatic   Number Minutes Vasopneumatic  10 minutes    Vasopnuematic Location  Shoulder    Vasopneumatic Pressure Low    Vasopneumatic Temperature  34*      Manual Therapy   Manual therapy comments PROM, G2-g3 inferior , ap jt mobs Lt GH jt, PROM to tolerance palpable edema in Lt shoulder                     PT Short Term Goals - 11/21/19 1042      PT SHORT TERM GOAL #1   Title Patient will  demonstrate independent use of home exercise program to maintain progress from in clinic treatments.    Status Achieved             PT Long Term Goals - 11/21/19 1042      PT LONG TERM GOAL #1   Title Patient will demonstrate/report pain at worst less than or equal to 2/10 to facilitate minimal limitation in daily activity secondary to pain symptoms.    Status On-going      PT LONG TERM GOAL #2   Title Patient will demonstrate independent use of home exercise program to facilitate ability to maintain/progress functional gains from skilled physical therapy services.    Status On-going      PT LONG TERM GOAL #3   Title Patient will demonstrate Lt [place] joint mobility WFL to facilitate usual self care, dressing, reaching overhead at PLOF s limitation due to symptoms.    Status On-going      PT LONG TERM GOAL #4   Title Patient will demonstrate Lt UE MMT  5/5 throughout to facilitate usual lifting, carrying in functional activity to PLOF s limitation.    Status On-going      PT LONG TERM GOAL #5   Title Pt. will demonstrate return to work at [employer] s limitation.    Status On-going                 Plan - 11/26/19 1011    Clinical Impression Statement Pt continues to demonstrate steady progress with PT working on ROM at home and in the clinic.  Overall will continue to benefit from PT to improve ROM and strength to return to work.    Rehab Potential Good    PT Duration Other (comment)    PT Treatment/Interventions ADLs/Self Care Home Management;Cryotherapy;Electrical Stimulation;Iontophoresis 4mg /ml Dexamethasone;Moist Heat;Balance training;Therapeutic exercise;Therapeutic activities;Functional mobility training;Stair training;Ultrasound;Neuromuscular re-education;Patient/family education;Manual techniques;Vasopneumatic Device;Taping;Dry needling;Passive range of motion;Spinal Manipulations;Joint Manipulations    PT Next Visit Plan AAROM, gravity reduced arom for strengthening.  End  range elevation progression in mobility.    PT Home Exercise Plan [id number]    Consulted and Agree with Plan of Care Patient           Patient will benefit from skilled therapeutic intervention in order to improve the following deficits and impairments:  Decreased endurance, Hypomobility, Increased edema, Decreased activity tolerance, Decreased strength, Impaired UE functional use, Pain, Decreased mobility, Decreased range of motion, Impaired perceived functional ability, Improper body mechanics, Impaired flexibility, Decreased coordination  Visit Diagnosis: Chronic left shoulder pain  Muscle weakness (generalized)  Stiffness of left shoulder, not elsewhere classified  Localized edema     Problem List Patient Active Problem List   Diagnosis Date Noted   Traumatic tear of supraspinatus tendon of left shoulder 05/29/2019   Tendinopathy of left biceps tendon 05/29/2019   Chronic bilateral low back pain 05/03/2019   Palpitations 05/18/2018   Chronic left-sided low back pain with left-sided sciatica 05/17/2018   Vertigo 05/17/2018   IBS 12/27/2007   Blood in stool 12/27/2007   ABDOMINAL PAIN, LOWER 12/27/2007      Laureen Abrahams, PT, DPT 11/26/19 10:12 AM    [facility] [facility] [address] [place], [place], [address] Phone: [phone]   Fax:  [phone]  Name: KARSYN JAMIE MRN: [id number] Date of Birth: Jun 06, 1965

## 2019-11-28 ENCOUNTER — Encounter: Payer: Self-pay | Admitting: Physical Therapy

## 2019-11-28 ENCOUNTER — Other Ambulatory Visit: Payer: Self-pay

## 2019-11-28 ENCOUNTER — Ambulatory Visit: Payer: 59 | Admitting: Physical Therapy

## 2019-11-28 DIAGNOSIS — M6281 Muscle weakness (generalized): Secondary | ICD-10-CM | POA: Diagnosis not present

## 2019-11-28 DIAGNOSIS — G8929 Other chronic pain: Secondary | ICD-10-CM

## 2019-11-28 DIAGNOSIS — R600 Localized edema: Secondary | ICD-10-CM

## 2019-11-28 DIAGNOSIS — M25612 Stiffness of left shoulder, not elsewhere classified: Secondary | ICD-10-CM | POA: Diagnosis not present

## 2019-11-28 DIAGNOSIS — M25512 Pain in left shoulder: Secondary | ICD-10-CM | POA: Diagnosis not present

## 2019-11-28 NOTE — Therapy (Signed)
[place], [place], [address] Phone: [phone]   Fax:  [phone]  Physical Therapy Treatment  Patient Details  Name: Cynthia Barron MRN: [id number] Date of Birth: [birth year] Referring Provider (PT): Dr. Erlinda Hong   Encounter Date: 11/28/2019   PT End of Session - 11/28/19 0843    Visit Number 6    Number of Visits 16    Date for PT Re-Evaluation 01/16/20    Progress Note Due on Visit 10    PT Start Time 0801    PT Stop Time 0843    PT Time Calculation (min) 42 min    Activity Tolerance Patient tolerated treatment well    Behavior During Therapy [facility] for tasks assessed/performed           Past Medical History:  Diagnosis Date  . Seasonal allergies   . Tachycardia     History reviewed. No pertinent surgical history.  There were no vitals filed for this visit.   Subjective Assessment - 11/28/19 0803    Subjective shoulder is doing well, still stiff    Limitations Lifting;House hold activities    Patient Stated Goals improve function    Currently in Pain? No/denies                             [facility] Adult PT Treatment/Exercise - 11/28/19 0804      Shoulder Exercises: Supine   Flexion Left;Weights   3x10   Shoulder Flexion Weight (lbs) 1      Shoulder Exercises: Sidelying   External Rotation Left;Weights   3x10   External Rotation Weight (lbs) 1    ABduction Left;AAROM;20 reps    ABduction Limitations scapular plane; to 90 degrees      Shoulder Exercises: Standing   Other Standing Exercises wall ladder flexion/scaption x 10 reps      Shoulder Exercises: Pulleys   Flexion 3 minutes    Flexion Limitations with 5 sec hold    Scaption 3 minutes    Scaption Limitations with 5 sec hold      Manual Therapy   Manual therapy comments PROM, G2-g3 inferior , ap jt mobs Lt GH jt, PROM to tolerance palpable edema in Lt shoulder                     PT Short Term Goals -  11/21/19 1042      PT SHORT TERM GOAL #1   Title Patient will demonstrate independent use of home exercise program to maintain progress from in clinic treatments.    Status Achieved             PT Long Term Goals - 11/21/19 1042      PT LONG TERM GOAL #1   Title Patient will demonstrate/report pain at worst less than or equal to 2/10 to facilitate minimal limitation in daily activity secondary to pain symptoms.    Status On-going      PT LONG TERM GOAL #2   Title Patient will demonstrate independent use of home exercise program to facilitate ability to maintain/progress functional gains from skilled physical therapy services.    Status On-going      PT LONG TERM GOAL #3   Title Patient will demonstrate Lt [place] joint mobility WFL to facilitate usual self care, dressing, reaching overhead at PLOF s limitation due to symptoms.    Status On-going      PT LONG  TERM GOAL #4   Title Patient will demonstrate Lt UE MMT 5/5 throughout to facilitate usual lifting, carrying in functional activity to PLOF s limitation.    Status On-going      PT LONG TERM GOAL #5   Title Pt. will demonstrate return to work at [employer] s limitation.    Status On-going                 Plan - 11/28/19 0844    Clinical Impression Statement Pt had some mild increase in sidelying abduction today so performed AA exercises in that motion today.  Overall progressing well with PT and will cotninue to benefit from PT to maximize function.    Rehab Potential Good    PT Duration Other (comment)    PT Treatment/Interventions ADLs/Self Care Home Management;Cryotherapy;Electrical Stimulation;Iontophoresis 4mg /ml Dexamethasone;Moist Heat;Balance training;Therapeutic exercise;Therapeutic activities;Functional mobility training;Stair training;Ultrasound;Neuromuscular re-education;Patient/family education;Manual techniques;Vasopneumatic Device;Taping;Dry needling;Passive range of motion;Spinal Manipulations;Joint  Manipulations    PT Next Visit Plan AAROM, gravity reduced arom for strengthening.  End range elevation progression in mobility., manual for end range ROM    PT Home Exercise Plan [id number]    Consulted and Agree with Plan of Care Patient           Patient will benefit from skilled therapeutic intervention in order to improve the following deficits and impairments:  Decreased endurance, Hypomobility, Increased edema, Decreased activity tolerance, Decreased strength, Impaired UE functional use, Pain, Decreased mobility, Decreased range of motion, Impaired perceived functional ability, Improper body mechanics, Impaired flexibility, Decreased coordination  Visit Diagnosis: Chronic left shoulder pain  Muscle weakness (generalized)  Stiffness of left shoulder, not elsewhere classified  Localized edema     Problem List Patient Active Problem List   Diagnosis Date Noted  . Traumatic tear of supraspinatus tendon of left shoulder 05/29/2019  . Tendinopathy of left biceps tendon 05/29/2019  . Chronic bilateral low back pain 05/03/2019  . Palpitations 05/18/2018  . Chronic left-sided low back pain with left-sided sciatica 05/17/2018  . Vertigo 05/17/2018  . IBS 12/27/2007  . Blood in stool 12/27/2007  . ABDOMINAL PAIN, LOWER 12/27/2007      Laureen Abrahams, PT, DPT 11/28/19 8:45 AM     [facility] Physical Therapy [address] [place], [place], [address] Phone: [phone]   Fax:  [phone]  Name: Cynthia Barron MRN: [id number] Date of Birth: April 03, 1965

## 2019-12-02 MED FILL — HYDROXYZINE PAM 25 MG CAP: 25 | 10 days supply | Qty: 30 | Fill #1

## 2019-12-02 MED FILL — METOPROLOL SUCCINATE ER 25: 25 | 90 days supply | Qty: 90 | Fill #1

## 2019-12-03 ENCOUNTER — Encounter: Payer: Self-pay | Admitting: Rehabilitative and Restorative Service Providers"

## 2019-12-03 ENCOUNTER — Ambulatory Visit: Payer: 59 | Admitting: Rehabilitative and Restorative Service Providers"

## 2019-12-03 ENCOUNTER — Other Ambulatory Visit: Payer: Self-pay

## 2019-12-03 DIAGNOSIS — G8929 Other chronic pain: Secondary | ICD-10-CM

## 2019-12-03 DIAGNOSIS — M6281 Muscle weakness (generalized): Secondary | ICD-10-CM

## 2019-12-03 DIAGNOSIS — M25512 Pain in left shoulder: Secondary | ICD-10-CM | POA: Diagnosis not present

## 2019-12-03 DIAGNOSIS — R600 Localized edema: Secondary | ICD-10-CM

## 2019-12-03 DIAGNOSIS — M25612 Stiffness of left shoulder, not elsewhere classified: Secondary | ICD-10-CM | POA: Diagnosis not present

## 2019-12-03 NOTE — Therapy (Signed)
[place], [place], [address] Phone: [phone]   Fax:  [phone]  Physical Therapy Treatment  Patient Details  Name: Cynthia Barron MRN: [id number] Date of Birth: [birth year] Referring Provider (PT): Dr. Erlinda Hong   Encounter Date: 12/03/2019   PT End of Session - 12/03/19 1144    Visit Number 7    Number of Visits 16    Date for PT Re-Evaluation 01/16/20    Progress Note Due on Visit 10    PT Start Time 1142    PT Stop Time 1221    PT Time Calculation (min) 39 min    Activity Tolerance Patient tolerated treatment well    Behavior During Therapy [facility] for tasks assessed/performed           Past Medical History:  Diagnosis Date  . Seasonal allergies   . Tachycardia     History reviewed. No pertinent surgical history.  There were no vitals filed for this visit.   Subjective Assessment - 12/03/19 1143    Subjective Pt. stated no pain today upon arrival.  Pt. stated catching in shoulder occurs when trying to lift arm in standing, lifting in lying down is improving.    Limitations Lifting;House hold activities    Patient Stated Goals improve function    Currently in Pain? No/denies    Pain Score 0-No pain              [facility] PT Assessment - 12/03/19 0001      Assessment   Medical Diagnosis S/P Lt shoulder RTC repair    Referring Provider (PT) Dr. Erlinda Hong    Onset Date/Surgical Date 09/26/19    Hand Dominance Right      AROM   Overall AROM Comments Against gravity 105 deg flexion c shrug noted Lt UE                         [facility] Adult PT Treatment/Exercise - 12/03/19 0001      Shoulder Exercises: Sidelying   External Rotation Left   3 x 15   External Rotation Weight (lbs) 1    Flexion Left   2 x 10 1 lb, x 10 0 lb   Flexion Weight (lbs) 1    ABduction Left   3 x 15   ABduction Weight (lbs) 1      Shoulder Exercises: ROM/Strengthening   UBE (Upper Arm Bike) Lvl 2.5 3 mins fwd/back  each way    [place] Lt UE flexion, scaption 20 x each      Manual Therapy   Manual therapy comments passive ROM, MWM c ER, MET contract/relax for ER/IR                    PT Short Term Goals - 11/21/19 1042      PT SHORT TERM GOAL #1   Title Patient will demonstrate independent use of home exercise program to maintain progress from in clinic treatments.    Status Achieved             PT Long Term Goals - 11/21/19 1042      PT LONG TERM GOAL #1   Title Patient will demonstrate/report pain at worst less than or equal to 2/10 to facilitate minimal limitation in daily activity secondary to pain symptoms.    Status On-going      PT LONG TERM GOAL #2   Title Patient will demonstrate independent use of  home exercise program to facilitate ability to maintain/progress functional gains from skilled physical therapy services.    Status On-going      PT LONG TERM GOAL #3   Title Patient will demonstrate Lt [place] joint mobility WFL to facilitate usual self care, dressing, reaching overhead at PLOF s limitation due to symptoms.    Status On-going      PT LONG TERM GOAL #4   Title Patient will demonstrate Lt UE MMT 5/5 throughout to facilitate usual lifting, carrying in functional activity to PLOF s limitation.    Status On-going      PT LONG TERM GOAL #5   Title Pt. will demonstrate return to work at [employer] s limitation.    Status On-going                 Plan - 12/03/19 1156    Clinical Impression Statement Pt. able to elevate c shrug to approx. 105 flexion on Lt UE.  Shrug onset approx. 75-80 degree.  Pt. demonstrated improve active movement control in gravity reduced intervention.  Current presentation indicated necessity for continued skilled PT services paired c HEP to promote improve motor control and strength/endurance.    Rehab Potential Good    PT Duration Other (comment)    PT Treatment/Interventions ADLs/Self Care Home Management;Cryotherapy;Electrical  Stimulation;Iontophoresis 57m/ml Dexamethasone;Moist Heat;Balance training;Therapeutic exercise;Therapeutic activities;Functional mobility training;Stair training;Ultrasound;Neuromuscular re-education;Patient/family education;Manual techniques;Vasopneumatic Device;Taping;Dry needling;Passive range of motion;Spinal Manipulations;Joint Manipulations    PT Next Visit Plan AAROM, gravity reduced arom for strengthening.  End range elevation progression in mobility., manual for end range ROM    PT Home Exercise Plan [email or web address]   Consulted and Agree with Plan of Care Patient           Patient will benefit from skilled therapeutic intervention in order to improve the following deficits and impairments:  Decreased endurance, Hypomobility, Increased edema, Decreased activity tolerance, Decreased strength, Impaired UE functional use, Pain, Decreased mobility, Decreased range of motion, Impaired perceived functional ability, Improper body mechanics, Impaired flexibility, Decreased coordination  Visit Diagnosis: Chronic left shoulder pain  Muscle weakness (generalized)  Stiffness of left shoulder, not elsewhere classified  Localized edema     Problem List Patient Active Problem List   Diagnosis Date Noted  . Traumatic tear of supraspinatus tendon of left shoulder 05/29/2019  . Tendinopathy of left biceps tendon 05/29/2019  . Chronic bilateral low back pain 05/03/2019  . Palpitations 05/18/2018  . Chronic left-sided low back pain with left-sided sciatica 05/17/2018  . Vertigo 05/17/2018  . IBS 12/27/2007  . Blood in stool 12/27/2007  . ABDOMINAL PAIN, LOWER 12/27/2007    MScot Jun PT, DPT, OCS, ATC 12/03/19  12:13 PM    [facility] [address] [address]Phone: [phone]  Fax:  [phone] Name: Cynthia GOODALLMRN: [id number] of Birth: 112/22/1967

## 2019-12-10 ENCOUNTER — Encounter: Payer: 59 | Admitting: Physical Therapy

## 2019-12-13 ENCOUNTER — Encounter: Payer: Self-pay | Admitting: Rehabilitative and Restorative Service Providers"

## 2019-12-13 ENCOUNTER — Other Ambulatory Visit: Payer: Self-pay

## 2019-12-13 ENCOUNTER — Ambulatory Visit: Payer: 59 | Admitting: Rehabilitative and Restorative Service Providers"

## 2019-12-13 DIAGNOSIS — G8929 Other chronic pain: Secondary | ICD-10-CM

## 2019-12-13 DIAGNOSIS — M6281 Muscle weakness (generalized): Secondary | ICD-10-CM | POA: Diagnosis not present

## 2019-12-13 DIAGNOSIS — M25512 Pain in left shoulder: Secondary | ICD-10-CM | POA: Diagnosis not present

## 2019-12-13 DIAGNOSIS — M25612 Stiffness of left shoulder, not elsewhere classified: Secondary | ICD-10-CM | POA: Diagnosis not present

## 2019-12-13 NOTE — Patient Instructions (Signed)
Access Code: [email or web address] URL: [email or web address] Date: 12/13/2019 Prepared by: Vista Mink  Exercises Standing Shoulder Internal Rotation Stretch with Hands Behind Back - 3-5 x daily - 7 x weekly - 1 sets - 10 reps - 10 hold Supine Shoulder Internal Rotation Stretch - 3-5 x daily - 7 x weekly - 1 sets - 10-20 reps - 10 secondsinutes hold Supine Shoulder External Rotation Stretch - 3-5 x daily - 7 x weekly - 1 sets - 10-20 reps - 10 seconds hold Supine Shoulder Flexion AAROM with [place] - 3-5 x daily - 7 x weekly - 3 sets - 10 reps

## 2019-12-13 NOTE — Therapy (Signed)
[place], [place], [address] Phone: [phone]   Fax:  [phone]  Physical Therapy Treatment  Patient Details  Name: Cynthia Barron MRN: [id number] Date of Birth: 03/07/1965 Referring Provider (PT): Dr. Erlinda Hong   Encounter Date: 12/13/2019   PT End of Session - 12/13/19 1426    Visit Number 8    Number of Visits 16    Date for PT Re-Evaluation 01/16/20    Progress Note Due on Visit 10    PT Start Time 1345    PT Stop Time 1430    PT Time Calculation (min) 45 min    Activity Tolerance Patient tolerated treatment well    Behavior During Therapy [facility] for tasks assessed/performed           Past Medical History:  Diagnosis Date  . Seasonal allergies   . Tachycardia     History reviewed. No pertinent surgical history.  There were no vitals filed for this visit.   Subjective Assessment - 12/13/19 1414    Subjective Cynthia Barron notes difficulty sleeping due to L shoulder pain.    Limitations Lifting;House hold activities    Patient Stated Goals improve function    Currently in Pain? Yes    Pain Score 4     Pain Location Shoulder    Pain Orientation Left    Pain Descriptors / Indicators Aching;Tightness;Sore    Pain Type Surgical pain    Pain Onset More than a month ago    Pain Frequency Intermittent    Aggravating Factors  Sleeping, reaching, behind the back or overhead    Pain Relieving Factors Exercise and ice    Effect of Pain on Daily Activities Out of work, limited with sleeping due to L shoulder    Multiple Pain Sites No                             [facility] Adult PT Treatment/Exercise - 12/13/19 0001      Exercises   Exercises Shoulder      Shoulder Exercises: Supine   External Rotation AROM;Left;10 reps   10 seconds   Internal Rotation AROM;Left;10 reps   10 seconds   Flexion AROM;Left;10 reps   10 seconds     Shoulder Exercises: Seated   Flexion AAROM;Left;10 reps   10 seconds  with pulley & scaption     Shoulder Exercises: Standing   Internal Rotation AROM;Left;10 reps   10 seconds thumb up the back                 PT Education - 12/13/19 1424    Education Details Reviewed and updated HEP with emphasis on AROM.    Person(s) Educated Patient    Methods Explanation;Demonstration;Tactile cues;Verbal cues;Handout    Comprehension Verbalized understanding;Returned demonstration;Verbal cues required;Need further instruction;Tactile cues required            PT Short Term Goals - 12/13/19 1425      PT SHORT TERM GOAL #1   Title Patient will demonstrate independent use of home exercise program to maintain progress from in clinic treatments.    Status On-going             PT Long Term Goals - 12/13/19 1425      PT LONG TERM GOAL #1   Title Patient will demonstrate/report pain at worst less than or equal to 2/10 to facilitate minimal limitation in daily activity secondary to pain  symptoms.    Status On-going      PT LONG TERM GOAL #2   Title Patient will demonstrate independent use of home exercise program to facilitate ability to maintain/progress functional gains from skilled physical therapy services.    Status On-going      PT LONG TERM GOAL #3   Title Patient will demonstrate Lt [place] joint mobility WFL to facilitate usual self care, dressing, reaching overhead at PLOF s limitation due to symptoms.    Status On-going      PT LONG TERM GOAL #4   Title Patient will demonstrate Lt UE MMT 5/5 throughout to facilitate usual lifting, carrying in functional activity to PLOF s limitation.    Status On-going      PT LONG TERM GOAL #5   Title Pt. will demonstrate return to work at [employer] s limitation.    Status On-going                 Plan - 12/13/19 1427    Clinical Impression Statement Cynthia Barron has significant capsular tightness that is limiting daily function and sleep at night.  Her HEP was updated with activities to improve this and allow  her to catch up to the expected 90% AROM at 12 weeks post-surgery.    Examination-Activity Limitations Carry;Dressing;Hygiene/Grooming;Lift;Reach Overhead    Examination-Participation Restrictions Community Activity;Yard Work;Laundry;Meal Prep;Occupation    Rehab Potential Good    PT Duration Other (comment)    PT Treatment/Interventions ADLs/Self Care Home Management;Cryotherapy;Electrical Stimulation;Iontophoresis 4mg /ml Dexamethasone;Moist Heat;Balance training;Therapeutic exercise;Therapeutic activities;Functional mobility training;Stair training;Ultrasound;Neuromuscular re-education;Patient/family education;Manual techniques;Vasopneumatic Device;Taping;Dry needling;Passive range of motion;Spinal Manipulations;Joint Manipulations    PT Next Visit Plan AROM    PT Home Exercise Plan [email or web address].  Access Code: [email or web address]    Consulted and Agree with Plan of Care Patient           Patient will benefit from skilled therapeutic intervention in order to improve the following deficits and impairments:  Decreased endurance, Hypomobility, Increased edema, Decreased activity tolerance, Decreased strength, Impaired UE functional use, Pain, Decreased mobility, Decreased range of motion, Impaired perceived functional ability, Improper body mechanics, Impaired flexibility, Decreased coordination  Visit Diagnosis: Stiffness of left shoulder, not elsewhere classified  Muscle weakness (generalized)  Chronic left shoulder pain     Problem List Patient Active Problem List   Diagnosis Date Noted  . Traumatic tear of supraspinatus tendon of left shoulder 05/29/2019  . Tendinopathy of left biceps tendon 05/29/2019  . Chronic bilateral low back pain 05/03/2019  . Palpitations 05/18/2018  . Chronic left-sided low back pain with left-sided sciatica 05/17/2018  . Vertigo 05/17/2018  . IBS 12/27/2007  . Blood in stool 12/27/2007  . ABDOMINAL PAIN, LOWER 12/27/2007    Farley Ly PT, MPT 12/13/2019, 2:30  PM  [facility] [address] [place], [place], [address] Phone: [phone]   Fax:  [phone]  Name: Cynthia Barron MRN: [id number] Date of Birth: 11-06-1965

## 2019-12-16 ENCOUNTER — Ambulatory Visit: Payer: 59 | Admitting: Rehabilitative and Restorative Service Providers"

## 2019-12-16 ENCOUNTER — Encounter: Payer: Self-pay | Admitting: Rehabilitative and Restorative Service Providers"

## 2019-12-16 ENCOUNTER — Other Ambulatory Visit: Payer: Self-pay

## 2019-12-16 DIAGNOSIS — M6281 Muscle weakness (generalized): Secondary | ICD-10-CM

## 2019-12-16 DIAGNOSIS — G8929 Other chronic pain: Secondary | ICD-10-CM

## 2019-12-16 DIAGNOSIS — M25512 Pain in left shoulder: Secondary | ICD-10-CM | POA: Diagnosis not present

## 2019-12-16 DIAGNOSIS — R600 Localized edema: Secondary | ICD-10-CM | POA: Diagnosis not present

## 2019-12-16 DIAGNOSIS — M25612 Stiffness of left shoulder, not elsewhere classified: Secondary | ICD-10-CM

## 2019-12-16 NOTE — Therapy (Signed)
[place], [place], [address] Phone: [phone]   Fax:  [phone]  Physical Therapy Treatment/Progress Note/Recertification  Patient Details  Name: Cynthia Barron MRN: [id number] Date of Birth: [birth year] Referring Provider (PT): Dr. Erlinda Hong   Encounter Date: 12/16/2019   Progress Note Reporting Period 11/07/2019 to 12/16/2019  See note below for Objective Data and Assessment of Progress/Goals.        PT End of Session - 12/16/19 0759    Visit Number 9    Number of Visits 16    Date for PT Re-Evaluation 01/27/20    Progress Note Due on Visit 19    PT Start Time 0800    PT Stop Time 0840    PT Time Calculation (min) 40 min    Activity Tolerance Patient tolerated treatment well    Behavior During Therapy [facility] for tasks assessed/performed           Past Medical History:  Diagnosis Date  . Seasonal allergies   . Tachycardia     History reviewed. No pertinent surgical history.  There were no vitals filed for this visit.   Subjective Assessment - 12/16/19 0802    Subjective Pt. indicated nothing really causing pain during the day.  Pt. stated some nighttime complaints noted that sometimes improve c HEP.    Limitations Lifting;House hold activities    Patient Stated Goals improve function    Pain Onset More than a month ago              [facility] PT Assessment - 12/16/19 0001      Assessment   Medical Diagnosis S/P Lt shoulder RTC repair    Referring Provider (PT) Dr. Erlinda Hong    Onset Date/Surgical Date 09/26/19    Hand Dominance Right      AROM   Overall AROM Comments Shrug noted above 100 deg flexion, above 45 deg abd against gravity on Lt UE    Left Shoulder Flexion 133 Degrees    Left Shoulder ABduction 110 Degrees    Left Shoulder Internal Rotation 72 Degrees    Left Shoulder External Rotation 54 Degrees      PROM   Left Shoulder Flexion 140 Degrees    Left Shoulder ABduction 120 Degrees     Left Shoulder Internal Rotation 75 Degrees    Left Shoulder External Rotation 55 Degrees      Strength   Left Shoulder Flexion 4-/5    Left Shoulder ABduction 2+/5    Left Shoulder Internal Rotation 5/5    Left Shoulder External Rotation 4/5                         [facility] Adult PT Treatment/Exercise - 12/16/19 0001      Shoulder Exercises: Sidelying   ABduction Left                  PT Education - 12/16/19 0827    Education Details POC Education    Person(s) Educated Patient    Methods Explanation    Comprehension Verbalized understanding            PT Short Term Goals - 12/16/19 0827      PT SHORT TERM GOAL #1   Title Patient will demonstrate independent use of home exercise program to maintain progress from in clinic treatments.    Baseline Independent c initial    Status Achieved  PT Long Term Goals - 12/16/19 3154      PT LONG TERM GOAL #1   Title Patient will demonstrate/report pain at worst less than or equal to 2/10 to facilitate minimal limitation in daily activity secondary to pain symptoms.    Time 6    Period Weeks    Status Revised    Target Date 01/27/20      PT LONG TERM GOAL #2   Title Patient will demonstrate independent use of home exercise program to facilitate ability to maintain/progress functional gains from skilled physical therapy services.    Time 6    Period Weeks    Status Revised    Target Date 01/27/20      PT LONG TERM GOAL #3   Title Patient will demonstrate Lt [place] joint mobility WFL to facilitate usual self care, dressing, reaching overhead at PLOF s limitation due to symptoms.    Time 6    Period Weeks    Status Revised    Target Date 01/27/20      PT LONG TERM GOAL #4   Title Patient will demonstrate Lt UE MMT 5/5 throughout to facilitate usual lifting, carrying in functional activity to PLOF s limitation.    Time 6    Period Weeks    Status On-going    Target Date 01/27/20      PT  LONG TERM GOAL #5   Title Pt. will demonstrate return to work at [employer] s limitation.    Time 6    Period Weeks    Status Revised    Target Date 01/27/20                 Plan - 12/16/19 0807    Clinical Impression Statement Pt. has attended 9 visits overall during course of treatment.   Pt. has self reported improvement in daily symptoms, c primary complaint being related to nighttime soreness that can be improved some c use of HEP.  See objective data for updated information.  Pt. demonstrated continued presentation of Lt shoulder pain c mobility, strength deficits that can impair daily activity from full PLOF at this time s deviation.  Recommend continued skilled PT services, though c reduced frequency to allow progression c HEP paired c in clinic activity.    Examination-Activity Limitations Carry;Dressing;Hygiene/Grooming;Lift;Reach Overhead    Examination-Participation Restrictions Community Activity;Yard Work;Laundry;Meal Prep;Occupation    Rehab Potential Good    PT Frequency --   1-2x/week   PT Duration 6 weeks    PT Treatment/Interventions ADLs/Self Care Home Management;Cryotherapy;Electrical Stimulation;Iontophoresis 4mg /ml Dexamethasone;Moist Heat;Balance training;Therapeutic exercise;Therapeutic activities;Functional mobility training;Stair training;Ultrasound;Neuromuscular re-education;Patient/family education;Manual techniques;Vasopneumatic Device;Taping;Dry needling;Passive range of motion;Spinal Manipulations;Joint Manipulations    PT Next Visit Plan Avoid training c shrug, progress end range mobility.    PT Home Exercise Plan [email or web address].  Access Code: [email or web address]    Consulted and Agree with Plan of Care Patient           Patient will benefit from skilled therapeutic intervention in order to improve the following deficits and impairments:  Decreased endurance, Hypomobility, Increased edema, Decreased activity tolerance, Decreased strength, Impaired UE functional use, Pain,  Decreased mobility, Decreased range of motion, Impaired perceived functional ability, Improper body mechanics, Impaired flexibility, Decreased coordination  Visit Diagnosis: Chronic left shoulder pain  Stiffness of left shoulder, not elsewhere classified  Muscle weakness (generalized)  Localized edema     Problem List Patient Active Problem List   Diagnosis Date Noted  . Traumatic tear of supraspinatus tendon of  left shoulder 05/29/2019  . Tendinopathy of left biceps tendon 05/29/2019  . Chronic bilateral low back pain 05/03/2019  . Palpitations 05/18/2018  . Chronic left-sided low back pain with left-sided sciatica 05/17/2018  . Vertigo 05/17/2018  . IBS 12/27/2007  . Blood in stool 12/27/2007  . ABDOMINAL PAIN, LOWER 12/27/2007    Scot Jun, PT, DPT, OCS, ATC 12/16/19  8:35 AM    [facility] [address] [place], [place], [address] Phone: [phone]   Fax:  [phone]  Name: Cynthia Barron MRN: [id number] Date of Birth: 01-12-1966

## 2019-12-17 ENCOUNTER — Encounter: Payer: 59 | Admitting: Physical Therapy

## 2019-12-18 ENCOUNTER — Encounter: Payer: Self-pay | Admitting: Orthopaedic Surgery

## 2019-12-18 ENCOUNTER — Other Ambulatory Visit: Payer: Self-pay | Admitting: Physician Assistant

## 2019-12-18 ENCOUNTER — Other Ambulatory Visit: Payer: Self-pay

## 2019-12-18 ENCOUNTER — Ambulatory Visit (INDEPENDENT_AMBULATORY_CARE_PROVIDER_SITE_OTHER): Payer: 59 | Admitting: Physician Assistant

## 2019-12-18 DIAGNOSIS — Z9889 Other specified postprocedural states: Secondary | ICD-10-CM

## 2019-12-18 MED ORDER — TRAMADOL HCL 50 MG PO TABS: 50.0000 mg | ORAL_TABLET | Freq: Four times a day (QID) | ORAL | 0 refills | Status: DC | PRN

## 2019-12-18 MED FILL — traMADol HCL 50 MG TABS: 50 | 8 days supply | Qty: 30 | Fill #0

## 2019-12-18 NOTE — Progress Notes (Signed)
   Post-Op Visit Note   Patient: Cynthia Barron           Date of Birth: 12-27-1965           MRN: [id number] Visit Date: 12/18/2019 PCP: Glendale Chard, MD   Assessment & Plan:  Chief Complaint:  Chief Complaint  Patient presents with  . Left Shoulder - Pain   Visit Diagnoses:  1. S/P left rotator cuff repair   2. S/P arthroscopy of left shoulder     Plan: Patient is a pleasant 54 year old female who comes in today 12 weeks out left shoulder arthroscopic debridement and rotator cuff repair.  She has been doing well.  She is in physical therapy making progress.  She still notes some soreness to the shoulder especially at night.  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  Examination of the left shoulder reveals forward flexion to about 150.  She can internally rotate to L5.  She has 3 out of 5 strength with resisted internal and external range of motion.  She is neurovascular intact distally.  At this point, she will continue working in physical therapy as well as a home exercise program.  We will write her a return to work note for 12/31/2019 for 6 weeks of no pushing, pulling or lifting more than 10 pounds.  She will follow up with [place] in 6 weeks time for recheck.  Follow-Up Instructions: Return in about 6 weeks (around 01/29/2020).   Orders:  No orders of the defined types were placed in this encounter.  Meds ordered this encounter  Medications  . traMADol (ULTRAM) 50 MG tablet    Sig: Take 1 tablet (50 mg total) by mouth every 6 (six) hours as needed.    Dispense:  30 tablet    Refill:  0    Imaging: No new imaging  PMFS History: Patient Active Problem List   Diagnosis Date Noted  . Traumatic tear of supraspinatus tendon of left shoulder 05/29/2019  . Tendinopathy of left biceps tendon 05/29/2019  . Chronic bilateral low back pain 05/03/2019  . Palpitations 05/18/2018  . Chronic left-sided low back pain with left-sided sciatica  05/17/2018  . Vertigo 05/17/2018  . IBS 12/27/2007  . Blood in stool 12/27/2007  . ABDOMINAL PAIN, LOWER 12/27/2007   Past Medical History:  Diagnosis Date  . Seasonal allergies   . Tachycardia     Family History  Problem Relation Age of Onset  . Parkinson's disease Mother   . Diabetes Mother   . Hypertension Mother   . Hyperlipidemia Mother   . Hypertension Father   . Hyperlipidemia Father     History reviewed. No pertinent surgical history. Social History   Occupational History  . Not on file  Tobacco Use  . Smoking status: Never Smoker  . Smokeless tobacco: Never Used  Substance and Sexual Activity  . Alcohol use: Not Currently  . Drug use: Never  . Sexual activity: Not on file

## 2019-12-19 ENCOUNTER — Telehealth: Payer: Self-pay

## 2019-12-19 NOTE — Telephone Encounter (Signed)
Patient came in referring to note for her job her employer is asking that the area of the body (left arm) is added to the note. She stated she will come pick the note up tomorrow morning. Call back:[phone]

## 2019-12-19 NOTE — Telephone Encounter (Signed)
Note made.  

## 2019-12-25 ENCOUNTER — Encounter: Payer: 59 | Admitting: Rehabilitative and Restorative Service Providers"

## 2019-12-27 ENCOUNTER — Ambulatory Visit: Payer: 59 | Admitting: Rehabilitative and Restorative Service Providers"

## 2019-12-27 ENCOUNTER — Other Ambulatory Visit: Payer: Self-pay

## 2019-12-27 ENCOUNTER — Encounter: Payer: Self-pay | Admitting: Rehabilitative and Restorative Service Providers"

## 2019-12-27 DIAGNOSIS — M25512 Pain in left shoulder: Secondary | ICD-10-CM | POA: Diagnosis not present

## 2019-12-27 DIAGNOSIS — R600 Localized edema: Secondary | ICD-10-CM

## 2019-12-27 DIAGNOSIS — M6281 Muscle weakness (generalized): Secondary | ICD-10-CM | POA: Diagnosis not present

## 2019-12-27 DIAGNOSIS — M25612 Stiffness of left shoulder, not elsewhere classified: Secondary | ICD-10-CM | POA: Diagnosis not present

## 2019-12-27 DIAGNOSIS — G8929 Other chronic pain: Secondary | ICD-10-CM | POA: Diagnosis not present

## 2019-12-27 NOTE — Therapy (Signed)
[place], [place], [address] Phone: [phone]   Fax:  [phone]  Physical Therapy Treatment  Patient Details  Name: Cynthia Barron MRN: [id number] Date of Birth: 01-04-1966 Referring Provider (PT): Dr. Erlinda Hong   Encounter Date: 12/27/2019   PT End of Session - 12/27/19 1000    Visit Number 10    Number of Visits 16    Date for PT Re-Evaluation 01/27/20    Progress Note Due on Visit 19    PT Start Time 0930    PT Stop Time 1012    PT Time Calculation (min) 42 min    Activity Tolerance Patient tolerated treatment well    Behavior During Therapy [facility] for tasks assessed/performed           Past Medical History:  Diagnosis Date  . Seasonal allergies   . Tachycardia     History reviewed. No pertinent surgical history.  There were no vitals filed for this visit.   Subjective Assessment - 12/27/19 0936    Subjective Pt. indicated no specific pain today.  Pt. stated getting better movement in day but lifting straight up is still hard.    Limitations Lifting;House hold activities    Patient Stated Goals improve function    Currently in Pain? No/denies    Pain Score 0-No pain    Pain Onset More than a month ago                             [facility] Adult PT Treatment/Exercise - 12/27/19 0001      Neuro Re-ed    Neuro Re-ed Details  100 deg flexion rhythmic stabilizations 30 sec bouts moderate resistance      Shoulder Exercises: Sidelying   External Rotation Left   x 35   External Rotation Weight (lbs) 2    ABduction Left   3 x 10   ABduction Weight (lbs) 3      Shoulder Exercises: Standing   External Rotation Both;Strengthening   2 x 10 performed at same time in neutral   Theraband Level (Shoulder External Rotation) Level 4 (Blue)      Manual Therapy   Manual therapy comments g3 inferior jt mobs Lt GH, MWM c ER in 75 deg abd x 10 c anterior force on joint, compresison c movement to Lt  lat, Lt infraspinatus                    PT Short Term Goals - 12/16/19 0827      PT SHORT TERM GOAL #1   Title Patient will demonstrate independent use of home exercise program to maintain progress from in clinic treatments.    Baseline Independent c initial    Status Achieved             PT Long Term Goals - 12/16/19 7353      PT LONG TERM GOAL #1   Title Patient will demonstrate/report pain at worst less than or equal to 2/10 to facilitate minimal limitation in daily activity secondary to pain symptoms.    Time 6    Period Weeks    Status Revised    Target Date 01/27/20      PT LONG TERM GOAL #2   Title Patient will demonstrate independent use of home exercise program to facilitate ability to maintain/progress functional gains from skilled physical therapy services.    Time 6  Period Weeks    Status Revised    Target Date 01/27/20      PT LONG TERM GOAL #3   Title Patient will demonstrate Lt [place] joint mobility WFL to facilitate usual self care, dressing, reaching overhead at PLOF s limitation due to symptoms.    Time 6    Period Weeks    Status Revised    Target Date 01/27/20      PT LONG TERM GOAL #4   Title Patient will demonstrate Lt UE MMT 5/5 throughout to facilitate usual lifting, carrying in functional activity to PLOF s limitation.    Time 6    Period Weeks    Status On-going    Target Date 01/27/20      PT LONG TERM GOAL #5   Title Pt. will demonstrate return to work at [employer] s limitation.    Time 6    Period Weeks    Status Revised    Target Date 01/27/20                 Plan - 12/27/19 0958    Clinical Impression Statement Lt [place] jt mobility improving in passive assessment.  Evidence of myofascial muscle length limitations in Lt lat, subscap, infraspinatus that may be impairing mobility.  Successful improvement in mobility within clinical time today following manual and exercise.  Progressive strengthening improvements noted in  gravity reduced. Shrug still noted in elevation above 100 deg flexion.    Examination-Activity Limitations Carry;Dressing;Hygiene/Grooming;Lift;Reach Overhead    Examination-Participation Restrictions Community Activity;Yard Work;Laundry;Meal Prep;Occupation    Rehab Potential Good    PT Frequency --   1-2x/week   PT Duration 6 weeks    PT Treatment/Interventions ADLs/Self Care Home Management;Cryotherapy;Electrical Stimulation;Iontophoresis 4mg /ml Dexamethasone;Moist Heat;Balance training;Therapeutic exercise;Therapeutic activities;Functional mobility training;Stair training;Ultrasound;Neuromuscular re-education;Patient/family education;Manual techniques;Vasopneumatic Device;Taping;Dry needling;Passive range of motion;Spinal Manipulations;Joint Manipulations    PT Next Visit Plan Avoid training c shrug, progress end range mobility c myofascial improvements.  Returning to work next week per report.    PT Home Exercise Plan [email or web address].  Access Code: [email or web address]    Consulted and Agree with Plan of Care Patient           Patient will benefit from skilled therapeutic intervention in order to improve the following deficits and impairments:  Decreased endurance, Hypomobility, Increased edema, Decreased activity tolerance, Decreased strength, Impaired UE functional use, Pain, Decreased mobility, Decreased range of motion, Impaired perceived functional ability, Improper body mechanics, Impaired flexibility, Decreased coordination  Visit Diagnosis: Chronic left shoulder pain  Stiffness of left shoulder, not elsewhere classified  Muscle weakness (generalized)  Localized edema     Problem List Patient Active Problem List   Diagnosis Date Noted  . Traumatic tear of supraspinatus tendon of left shoulder 05/29/2019  . Tendinopathy of left biceps tendon 05/29/2019  . Chronic bilateral low back pain 05/03/2019  . Palpitations 05/18/2018  . Chronic left-sided low back pain with left-sided sciatica  05/17/2018  . Vertigo 05/17/2018  . IBS 12/27/2007  . Blood in stool 12/27/2007  . ABDOMINAL PAIN, LOWER 12/27/2007    Scot Jun, PT, DPT, OCS, ATC 12/27/19  10:05 AM    [facility] Physical Therapy [address] [place], [place], [address] Phone: [phone]   Fax:  [phone]  Name: AHMIYAH COIL MRN: [id number] Date of Birth: Jun 29, 1965

## 2019-12-30 ENCOUNTER — Encounter: Payer: 59 | Admitting: Rehabilitative and Restorative Service Providers"

## 2019-12-30 ENCOUNTER — Telehealth: Payer: Self-pay

## 2019-12-30 NOTE — Telephone Encounter (Signed)
Tried calling patient regarding forms. She needs to call me back. LMOM x 2

## 2019-12-31 ENCOUNTER — Telehealth: Payer: Self-pay

## 2019-12-31 NOTE — Telephone Encounter (Signed)
Patient emailed me the "[employer] for duty/ return to work release form." States she previously brought it in. Somehow it got misplaced. I never received form. She emailed it to me this AM. I did fill out the form and will send to [facility]. I did advise her in the future if she needs any more forms filled out, this will need to go through [employer].  Patient aware. Emailed her completed forms.

## 2020-01-06 ENCOUNTER — Encounter: Payer: Self-pay | Admitting: Rehabilitative and Restorative Service Providers"

## 2020-01-06 ENCOUNTER — Other Ambulatory Visit: Payer: Self-pay

## 2020-01-06 ENCOUNTER — Ambulatory Visit: Payer: 59 | Admitting: Rehabilitative and Restorative Service Providers"

## 2020-01-06 DIAGNOSIS — M6281 Muscle weakness (generalized): Secondary | ICD-10-CM

## 2020-01-06 DIAGNOSIS — M25612 Stiffness of left shoulder, not elsewhere classified: Secondary | ICD-10-CM

## 2020-01-06 DIAGNOSIS — G8929 Other chronic pain: Secondary | ICD-10-CM

## 2020-01-06 DIAGNOSIS — M25512 Pain in left shoulder: Secondary | ICD-10-CM

## 2020-01-06 DIAGNOSIS — R600 Localized edema: Secondary | ICD-10-CM | POA: Diagnosis not present

## 2020-01-06 NOTE — Therapy (Signed)
[place], [place], [address] Phone: [phone]   Fax:  [phone]  Physical Therapy Treatment  Patient Details  Name: Cynthia Barron MRN: [id number] Date of Birth: 09-14-1965 Referring Provider (PT): Dr. Erlinda Hong   Encounter Date: 01/06/2020   PT End of Session - 01/06/20 0757    Visit Number 11    Number of Visits 16    Date for PT Re-Evaluation 01/27/20    Progress Note Due on Visit 76    PT Start Time 0758    PT Stop Time 0838    PT Time Calculation (min) 40 min    Activity Tolerance Patient tolerated treatment well    Behavior During Therapy [facility] for tasks assessed/performed           Past Medical History:  Diagnosis Date  . Seasonal allergies   . Tachycardia     History reviewed. No pertinent surgical history.  There were no vitals filed for this visit.   Subjective Assessment - 01/06/20 0805    Subjective Pt. indicated feeling pretty good today.  Pt. stated went to work and felt some pain that first day when getting home but it resolved.  Each day was better.    Limitations Lifting;House hold activities    Patient Stated Goals improve function    Currently in Pain? No/denies    Pain Score 0-No pain    Pain Onset More than a month ago    Pain Frequency Occasional    Aggravating Factors  after work soreness                             [facility] Adult PT Treatment/Exercise - 01/06/20 0001      Shoulder Exercises: Seated   Other Seated Exercises seated blue band lat pull down 3 x 15      Shoulder Exercises: Sidelying   Flexion Left   3 x 10   Flexion Weight (lbs) 3    ABduction Left   3 x 15   ABduction Weight (lbs) 3      Shoulder Exercises: Standing   Internal Rotation Left   3 x 15   Theraband Level (Shoulder Internal Rotation) Level 4 (Blue)    Other Standing Exercises SA hold ER blue band hold foam roller flexion on wall 2 x 10                    PT Short  Term Goals - 12/16/19 0827      PT SHORT TERM GOAL #1   Title Patient will demonstrate independent use of home exercise program to maintain progress from in clinic treatments.    Baseline Independent c initial    Status Achieved             PT Long Term Goals - 12/16/19 3299      PT LONG TERM GOAL #1   Title Patient will demonstrate/report pain at worst less than or equal to 2/10 to facilitate minimal limitation in daily activity secondary to pain symptoms.    Time 6    Period Weeks    Status Revised    Target Date 01/27/20      PT LONG TERM GOAL #2   Title Patient will demonstrate independent use of home exercise program to facilitate ability to maintain/progress functional gains from skilled physical therapy services.    Time 6    Period Weeks  Status Revised    Target Date 01/27/20      PT LONG TERM GOAL #3   Title Patient will demonstrate Lt [place] joint mobility WFL to facilitate usual self care, dressing, reaching overhead at PLOF s limitation due to symptoms.    Time 6    Period Weeks    Status Revised    Target Date 01/27/20      PT LONG TERM GOAL #4   Title Patient will demonstrate Lt UE MMT 5/5 throughout to facilitate usual lifting, carrying in functional activity to PLOF s limitation.    Time 6    Period Weeks    Status On-going    Target Date 01/27/20      PT LONG TERM GOAL #5   Title Pt. will demonstrate return to work at [employer] s limitation.    Time 6    Period Weeks    Status Revised    Target Date 01/27/20                 Plan - 01/06/20 0824    Clinical Impression Statement Elevation attempts improved upon inspection today, moving past 100 deg prior to shrug.  Movement approx. 85% total in elevation when compared to Rt today.  Continued progression in strengthening indicated at this time.    Examination-Activity Limitations Carry;Dressing;Hygiene/Grooming;Lift;Reach Overhead    Examination-Participation Restrictions Community Activity;Yard  Work;Laundry;Meal Prep;Occupation    Rehab Potential Good    PT Frequency --   1-2x/week   PT Duration 6 weeks    PT Treatment/Interventions ADLs/Self Care Home Management;Cryotherapy;Electrical Stimulation;Iontophoresis 4mg /ml Dexamethasone;Moist Heat;Balance training;Therapeutic exercise;Therapeutic activities;Functional mobility training;Stair training;Ultrasound;Neuromuscular re-education;Patient/family education;Manual techniques;Vasopneumatic Device;Taping;Dry needling;Passive range of motion;Spinal Manipulations;Joint Manipulations    PT Next Visit Plan Continue to progress strengthening with no shrug activity.  Continue to follow and address complaints from work with progression to HEP d/c as presentation allows.    PT Home Exercise Plan [email or web address].  Access Code: [email or web address]    Consulted and Agree with Plan of Care Patient           Patient will benefit from skilled therapeutic intervention in order to improve the following deficits and impairments:  Decreased endurance, Hypomobility, Increased edema, Decreased activity tolerance, Decreased strength, Impaired UE functional use, Pain, Decreased mobility, Decreased range of motion, Impaired perceived functional ability, Improper body mechanics, Impaired flexibility, Decreased coordination  Visit Diagnosis: Chronic left shoulder pain  Stiffness of left shoulder, not elsewhere classified  Muscle weakness (generalized)  Localized edema     Problem List Patient Active Problem List   Diagnosis Date Noted  . Traumatic tear of supraspinatus tendon of left shoulder 05/29/2019  . Tendinopathy of left biceps tendon 05/29/2019  . Chronic bilateral low back pain 05/03/2019  . Palpitations 05/18/2018  . Chronic left-sided low back pain with left-sided sciatica 05/17/2018  . Vertigo 05/17/2018  . IBS 12/27/2007  . Blood in stool 12/27/2007  . ABDOMINAL PAIN, LOWER 12/27/2007    Scot Jun, PT, DPT, OCS, ATC 01/06/20  8:42  AM    [facility] Physical Therapy [address] [place], [place], [address] Phone: [phone]   Fax:  [phone]  Name: Cynthia Barron MRN: [id number] Date of Birth: 1965-12-04

## 2020-01-13 ENCOUNTER — Encounter: Payer: Self-pay | Admitting: Physical Therapy

## 2020-01-13 ENCOUNTER — Other Ambulatory Visit: Payer: Self-pay

## 2020-01-13 ENCOUNTER — Ambulatory Visit (INDEPENDENT_AMBULATORY_CARE_PROVIDER_SITE_OTHER): Payer: 59 | Admitting: Physical Therapy

## 2020-01-13 DIAGNOSIS — M25612 Stiffness of left shoulder, not elsewhere classified: Secondary | ICD-10-CM | POA: Diagnosis not present

## 2020-01-13 DIAGNOSIS — M6281 Muscle weakness (generalized): Secondary | ICD-10-CM | POA: Diagnosis not present

## 2020-01-13 DIAGNOSIS — R600 Localized edema: Secondary | ICD-10-CM | POA: Diagnosis not present

## 2020-01-13 DIAGNOSIS — M25512 Pain in left shoulder: Secondary | ICD-10-CM | POA: Diagnosis not present

## 2020-01-13 DIAGNOSIS — G8929 Other chronic pain: Secondary | ICD-10-CM

## 2020-01-13 NOTE — Therapy (Signed)
[place], [place], [address] Phone: [phone]   Fax:  [phone]  Physical Therapy Treatment  Patient Details  Name: Cynthia Barron MRN: [id number] Date of Birth: 01/24/1966 Referring Provider (PT): Dr. Erlinda Hong   Encounter Date: 01/13/2020   PT End of Session - 01/13/20 0921    Visit Number 12    Number of Visits 16    Date for PT Re-Evaluation 01/27/20    Progress Note Due on Visit 19    PT Start Time 0844    PT Stop Time 0925    PT Time Calculation (min) 41 min    Activity Tolerance Patient tolerated treatment well    Behavior During Therapy [facility] for tasks assessed/performed           Past Medical History:  Diagnosis Date  . Seasonal allergies   . Tachycardia     History reviewed. No pertinent surgical history.  There were no vitals filed for this visit.   Subjective Assessment - 01/13/20 0843    Subjective no pain the shoulder is just stiff    Limitations Lifting;House hold activities    Patient Stated Goals improve function    Currently in Pain? No/denies    Pain Onset --                             [facility] Adult PT Treatment/Exercise - 01/13/20 0847      Shoulder Exercises: Sidelying   External Rotation Left   3x10   External Rotation Weight (lbs) 3    Flexion Left   3 x 10   Flexion Weight (lbs) 3    ABduction Left   3 x 10   ABduction Weight (lbs) 2, 3    ABduction Limitations hold x 5 sec at end range for ROM stretch      Shoulder Exercises: Standing   External Rotation Left;Theraband   3x10   Theraband Level (Shoulder External Rotation) Level 4 (Blue)    Internal Rotation Left   3x10   Theraband Level (Shoulder Internal Rotation) Level 4 (Blue)    ABduction AAROM;Left   3x10   ABduction Limitations AA concentric; active eccentric with 2# wrist weight    Row Both   3x10   Theraband Level (Shoulder Row) Level 4 (Blue)    Row Limitations overhead pull down       Shoulder Exercises: Pulleys   Flexion 2 minutes    Scaption 2 minutes      Shoulder Exercises: ROM/Strengthening   UBE (Upper Arm Bike) Lvl 3 2 mins fwd/back each way      Shoulder Exercises: Stretch   Other Shoulder Stretches ER doorway stretch 30 sec x 5 Lt UE 90/90                    PT Short Term Goals - 12/16/19 0827      PT SHORT TERM GOAL #1   Title Patient will demonstrate independent use of home exercise program to maintain progress from in clinic treatments.    Baseline Independent c initial    Status Achieved             PT Long Term Goals - 12/16/19 1025      PT LONG TERM GOAL #1   Title Patient will demonstrate/report pain at worst less than or equal to 2/10 to facilitate minimal limitation in daily activity secondary to pain symptoms.  Time 6    Period Weeks    Status Revised    Target Date 01/27/20      PT LONG TERM GOAL #2   Title Patient will demonstrate independent use of home exercise program to facilitate ability to maintain/progress functional gains from skilled physical therapy services.    Time 6    Period Weeks    Status Revised    Target Date 01/27/20      PT LONG TERM GOAL #3   Title Patient will demonstrate Lt [place] joint mobility WFL to facilitate usual self care, dressing, reaching overhead at PLOF s limitation due to symptoms.    Time 6    Period Weeks    Status Revised    Target Date 01/27/20      PT LONG TERM GOAL #4   Title Patient will demonstrate Lt UE MMT 5/5 throughout to facilitate usual lifting, carrying in functional activity to PLOF s limitation.    Time 6    Period Weeks    Status On-going    Target Date 01/27/20      PT LONG TERM GOAL #5   Title Pt. will demonstrate return to work at [employer] s limitation.    Time 6    Period Weeks    Status Revised    Target Date 01/27/20                 Plan - 01/13/20 2353    Clinical Impression Statement Pt overall doing very well with focus on strength and end  range abduction which quicly improved with stretching.  Overall progressing well, and anticipate transition to home program in next 1-2 visits.    Examination-Activity Limitations Carry;Dressing;Hygiene/Grooming;Lift;Reach Overhead    Examination-Participation Restrictions Community Activity;Yard Work;Laundry;Meal Prep;Occupation    Rehab Potential Good    PT Frequency --   1-2x/week   PT Duration 6 weeks    PT Treatment/Interventions ADLs/Self Care Home Management;Cryotherapy;Electrical Stimulation;Iontophoresis 4mg /ml Dexamethasone;Moist Heat;Balance training;Therapeutic exercise;Therapeutic activities;Functional mobility training;Stair training;Ultrasound;Neuromuscular re-education;Patient/family education;Manual techniques;Vasopneumatic Device;Taping;Dry needling;Passive range of motion;Spinal Manipulations;Joint Manipulations    PT Next Visit Plan Continue to progress strengthening with no shrug activity.  Continue to follow and address complaints from work with progression to HEP d/c as presentation allows.    PT Home Exercise Plan [id number].  Access Code: [email or web address]    Consulted and Agree with Plan of Care Patient           Patient will benefit from skilled therapeutic intervention in order to improve the following deficits and impairments:  Decreased endurance, Hypomobility, Increased edema, Decreased activity tolerance, Decreased strength, Impaired UE functional use, Pain, Decreased mobility, Decreased range of motion, Impaired perceived functional ability, Improper body mechanics, Impaired flexibility, Decreased coordination  Visit Diagnosis: Chronic left shoulder pain  Stiffness of left shoulder, not elsewhere classified  Muscle weakness (generalized)  Localized edema     Problem List Patient Active Problem List   Diagnosis Date Noted  . Traumatic tear of supraspinatus tendon of left shoulder 05/29/2019  . Tendinopathy of left biceps tendon 05/29/2019  . Chronic bilateral  low back pain 05/03/2019  . Palpitations 05/18/2018  . Chronic left-sided low back pain with left-sided sciatica 05/17/2018  . Vertigo 05/17/2018  . IBS 12/27/2007  . Blood in stool 12/27/2007  . ABDOMINAL PAIN, LOWER 12/27/2007      Laureen Abrahams, PT, DPT 01/13/20 9:24 AM    [facility] Physical Therapy [address] [place], [place], [address] Phone: [phone]   Fax:  [phone]  Name: Cynthia Barron MRN: [id number] Date of Birth: May 13, 1965

## 2020-01-20 ENCOUNTER — Ambulatory Visit: Payer: 59 | Admitting: Physical Therapy

## 2020-01-20 ENCOUNTER — Encounter: Payer: Self-pay | Admitting: Physical Therapy

## 2020-01-20 ENCOUNTER — Other Ambulatory Visit: Payer: Self-pay

## 2020-01-20 DIAGNOSIS — R600 Localized edema: Secondary | ICD-10-CM | POA: Diagnosis not present

## 2020-01-20 DIAGNOSIS — M25612 Stiffness of left shoulder, not elsewhere classified: Secondary | ICD-10-CM

## 2020-01-20 DIAGNOSIS — G8929 Other chronic pain: Secondary | ICD-10-CM

## 2020-01-20 DIAGNOSIS — M25512 Pain in left shoulder: Secondary | ICD-10-CM

## 2020-01-20 DIAGNOSIS — M6281 Muscle weakness (generalized): Secondary | ICD-10-CM

## 2020-01-20 NOTE — Therapy (Signed)
[place], [place], [address] Phone: [phone]   Fax:  [phone]  Physical Therapy Treatment/Discharge Summary  Patient Details  Name: Cynthia Barron MRN: [id number] Date of Birth: 1965-04-17 Referring Provider (PT): Dr. Erlinda Hong   Encounter Date: 01/20/2020   PT End of Session - 01/20/20 0923    Visit Number 13    Number of Visits 16    Date for PT Re-Evaluation 01/27/20    Progress Note Due on Visit 37    PT Start Time 0846    PT Stop Time 0910    PT Time Calculation (min) 24 min    Activity Tolerance Patient tolerated treatment well    Behavior During Therapy [facility] for tasks assessed/performed           Past Medical History:  Diagnosis Date  . Seasonal allergies   . Tachycardia     History reviewed. No pertinent surgical history.  There were no vitals filed for this visit.   Subjective Assessment - 01/20/20 0848    Subjective doing well, shoulder still stiff    Limitations Lifting;House hold activities    Patient Stated Goals improve function    Currently in Pain? No/denies              [facility] PT Assessment - 01/20/20 0857      Assessment   Medical Diagnosis S/P Lt shoulder RTC repair    Referring Provider (PT) Dr. Erlinda Hong    Onset Date/Surgical Date 09/26/19      AROM   Left Shoulder Flexion 140 Degrees   sitting   Left Shoulder ABduction 135 Degrees   sitting   Left Shoulder Internal Rotation --   T9/10   Left Shoulder External Rotation --   T3/4     Strength   Left Shoulder Flexion 5/5    Left Shoulder ABduction 4+/5    Left Shoulder Internal Rotation 5/5    Left Shoulder External Rotation 5/5    Left Elbow Flexion 5/5    Left Elbow Extension 5/5                         [facility] Adult PT Treatment/Exercise - 01/20/20 0849      Self-Care   Self-Care Other Self-Care Comments    Other Self-Care Comments  reviewed/updated HEP, pt verbalized understanding      Shoulder  Exercises: ROM/Strengthening   UBE (Upper Arm Bike) Lvl 5 2 mins fwd/back each way      Shoulder Exercises: Stretch   Cross Chest Stretch 3 reps;30 seconds                  PT Education - 01/20/20 0923    Education Details HEP updated, consolidated to 1 access code    Person(s) Educated Patient    Methods Explanation;Demonstration;Handout    Comprehension Verbalized understanding            PT Short Term Goals - 12/16/19 0827      PT SHORT TERM GOAL #1   Title Patient will demonstrate independent use of home exercise program to maintain progress from in clinic treatments.    Baseline Independent c initial    Status Achieved             PT Long Term Goals - 01/20/20 9983      PT LONG TERM GOAL #1   Title Patient will demonstrate/report pain at worst less than or equal to 2/10 to  facilitate minimal limitation in daily activity secondary to pain symptoms.    Time 6    Period Weeks    Status Achieved      PT LONG TERM GOAL #2   Title Patient will demonstrate independent use of home exercise program to facilitate ability to maintain/progress functional gains from skilled physical therapy services.    Time 6    Period Weeks    Status Achieved      PT LONG TERM GOAL #3   Title Patient will demonstrate Lt [place] joint mobility WFL to facilitate usual self care, dressing, reaching overhead at PLOF s limitation due to symptoms.    Time 6    Period Weeks    Status Achieved      PT LONG TERM GOAL #4   Title Patient will demonstrate Lt UE MMT 5/5 throughout to facilitate usual lifting, carrying in functional activity to PLOF s limitation.    Time 6    Period Weeks    Status Partially Met      PT LONG TERM GOAL #5   Title Pt. will demonstrate return to work at [employer] s limitation.    Time 6    Period Weeks    Status Achieved                 Plan - 01/20/20 9628    Clinical Impression Statement Pt has met/partially met all LTGs and is ready for d/c.   Encouraged continued work on HEP at home to maximize strength and progress.  Pt verbalized understading.    Examination-Activity Limitations Carry;Dressing;Hygiene/Grooming;Lift;Reach Overhead    Examination-Participation Restrictions Community Activity;Yard Work;Laundry;Meal Prep;Occupation    Rehab Potential Good    PT Frequency --   1-2x/week   PT Duration 6 weeks    PT Treatment/Interventions ADLs/Self Care Home Management;Cryotherapy;Electrical Stimulation;Iontophoresis 71m/ml Dexamethasone;Moist Heat;Balance training;Therapeutic exercise;Therapeutic activities;Functional mobility training;Stair training;Ultrasound;Neuromuscular re-education;Patient/family education;Manual techniques;Vasopneumatic Device;Taping;Dry needling;Passive range of motion;Spinal Manipulations;Joint Manipulations    PT Next Visit Plan d/c PT today    PT Home Exercise Plan Access Code: [email or web address]   Consulted and Agree with Plan of Care Patient           Patient will benefit from skilled therapeutic intervention in order to improve the following deficits and impairments:  Decreased endurance, Hypomobility, Increased edema, Decreased activity tolerance, Decreased strength, Impaired UE functional use, Pain, Decreased mobility, Decreased range of motion, Impaired perceived functional ability, Improper body mechanics, Impaired flexibility, Decreased coordination  Visit Diagnosis: Chronic left shoulder pain  Stiffness of left shoulder, not elsewhere classified  Muscle weakness (generalized)  Localized edema     Problem List Patient Active Problem List   Diagnosis Date Noted  . Traumatic tear of supraspinatus tendon of left shoulder 05/29/2019  . Tendinopathy of left biceps tendon 05/29/2019  . Chronic bilateral low back pain 05/03/2019  . Palpitations 05/18/2018  . Chronic left-sided low back pain with left-sided sciatica 05/17/2018  . Vertigo 05/17/2018  . IBS 12/27/2007  . Blood in stool 12/27/2007  .  ABDOMINAL PAIN, LOWER 12/27/2007      SLaureen Abrahams PT, DPT 01/20/20 9:26 AM    [facility] [address] [address]Phone: [phone]  Fax:  [phone] Name: Cynthia ARCAMRN: [id number] of Birth: 130-Sep-1967   PHYSICAL THERAPY DISCHARGE SUMMARY  Visits from Start of Care: 13  Current functional level related to goals / functional outcomes: See above   Remaining deficits: See above  Education / Equipment: HEP  Plan: Patient agrees to discharge.  Patient goals were partially met. Patient is being discharged due to meeting the stated rehab goals.  ?????    Laureen Abrahams, PT, DPT 01/20/20 9:26 AM [facility] Physical Therapy [address] [place], [place], [address] Phone: [phone]   Fax:  [phone]

## 2020-01-20 NOTE — Patient Instructions (Signed)
Access Code: [email or web address] URL: [email or web address] Date: 01/20/2020 Prepared by: Faustino Congress  Exercises Standing Shoulder Internal Rotation Stretch with Hands Behind Back - 3-5 x daily - 7 x weekly - 1 sets - 10 reps - 10 hold Supine Shoulder Internal Rotation Stretch - 3-5 x daily - 7 x weekly - 1 sets - 10-20 reps - 10 secondsinutes hold Supine Shoulder External Rotation Stretch - 3-5 x daily - 7 x weekly - 1 sets - 10-20 reps - 10 seconds hold Supine Shoulder Flexion AAROM with Dowel - 3-5 x daily - 7 x weekly - 3 sets - 10 reps Standing Shoulder Posterior Capsule Stretch - 1 x daily - 7 x weekly - 1 sets - 3 reps - 30 sec hold Standing Shoulder Flexion to 90 Degrees with Dumbbells - 1 x daily - 7 x weekly - 3 sets - 10 reps Standing Single Arm Shoulder Abduction with Dumbbell - Thumb Up - 1 x daily - 7 x weekly - 3 sets - 10 reps Sidelying Shoulder ER with Towel and Dumbbell - 1 x daily - 7 x weekly - 3 sets - 10 reps

## 2020-01-27 ENCOUNTER — Encounter: Payer: 59 | Admitting: Physical Therapy

## 2020-01-27 MED FILL — traZODone HCL 50 MG TABS: 50 | 30 days supply | Qty: 30 | Fill #1

## 2020-01-28 DIAGNOSIS — Z029 Encounter for administrative examinations, unspecified: Secondary | ICD-10-CM

## 2020-01-29 ENCOUNTER — Ambulatory Visit: Payer: 59 | Admitting: Physician Assistant

## 2020-01-29 ENCOUNTER — Ambulatory Visit: Payer: 59 | Admitting: Orthopaedic Surgery

## 2020-01-31 ENCOUNTER — Other Ambulatory Visit: Payer: Self-pay

## 2020-01-31 ENCOUNTER — Other Ambulatory Visit: Payer: Self-pay | Admitting: Orthopaedic Surgery

## 2020-01-31 ENCOUNTER — Encounter: Payer: Self-pay | Admitting: Orthopaedic Surgery

## 2020-01-31 ENCOUNTER — Ambulatory Visit (INDEPENDENT_AMBULATORY_CARE_PROVIDER_SITE_OTHER): Payer: 59 | Admitting: Orthopaedic Surgery

## 2020-01-31 DIAGNOSIS — Z9889 Other specified postprocedural states: Secondary | ICD-10-CM

## 2020-01-31 MED ORDER — MELOXICAM 7.5 MG PO TABS: 7.5000 mg | ORAL_TABLET | Freq: Two times a day (BID) | ORAL | 2 refills | Status: DC | PRN

## 2020-01-31 MED FILL — MELOXICAM 7.5 MG TABLET: 7.5 | 30 days supply | Qty: 60 | Fill #0

## 2020-01-31 NOTE — Progress Notes (Signed)
° °  Post-Op Visit Note   Patient: Cynthia Barron           Date of Birth: 1966-02-14           MRN: [id number] Visit Date: 01/31/2020 PCP: Glendale Chard, MD   Assessment & Plan:  Chief Complaint:  Chief Complaint  Patient presents with   Left Shoulder - Follow-up   Visit Diagnoses:  1. S/P left rotator cuff repair     Plan:   Cynthia Barron is approximately 4 months status post left rotator cuff repair.  She has completed outpatient PT.  She is currently on light duty at the hospital.  She endorses mainly soreness and stiffness that is worse in the morning.  Overall she is doing well and has no real complaints.  She is pleased with her rehab and recovery so far.  Physical exam shows fully healed surgical scars.  She has slight limitation in active range of motion secondary to discomfort.  Her passive range of motion is approximately 15 degrees better than the active range of motion.  Manual muscle testing of the rotator cuff reveals nearly full strength.  From my standpoint she is doing very well and recovering nicely.  She will continue her home exercises in her rehab on her own.  We will see her back in 2 months for her 96-month appointment.  I feel that she would be ready to return back to full duty on February 17, 2020 but I would like her to remain on light duty for another couple weeks so that she can do more home exercises.  Follow-Up Instructions: Return in about 2 months (around 04/02/2020).   Orders:  No orders of the defined types were placed in this encounter.  Meds ordered this encounter  Medications   meloxicam (MOBIC) 7.5 MG tablet    Sig: Take 1 tablet (7.5 mg total) by mouth 2 (two) times daily as needed for pain.    Dispense:  60 tablet    Refill:  2    Imaging: No results found.  PMFS History: Patient Active Problem List   Diagnosis Date Noted   Traumatic tear of supraspinatus tendon of left shoulder 05/29/2019   Tendinopathy of left biceps tendon  05/29/2019   Chronic bilateral low back pain 05/03/2019   Palpitations 05/18/2018   Chronic left-sided low back pain with left-sided sciatica 05/17/2018   Vertigo 05/17/2018   IBS 12/27/2007   Blood in stool 12/27/2007   ABDOMINAL PAIN, LOWER 12/27/2007   Past Medical History:  Diagnosis Date   Seasonal allergies    Tachycardia     Family History  Problem Relation Age of Onset   Parkinson's disease Mother    Diabetes Mother    Hypertension Mother    Hyperlipidemia Mother    Hypertension Father    Hyperlipidemia Father     History reviewed. No pertinent surgical history. Social History   Occupational History   Not on file  Tobacco Use   Smoking status: Never Smoker   Smokeless tobacco: Never Used  Substance and Sexual Activity   Alcohol use: Not Currently   Drug use: Never   Sexual activity: Not on file

## 2020-02-03 ENCOUNTER — Encounter: Payer: 59 | Admitting: Rehabilitative and Restorative Service Providers"

## 2020-02-24 ENCOUNTER — Encounter: Payer: 59 | Admitting: Nurse Practitioner

## 2020-02-24 NOTE — Progress Notes (Signed)
Pt no showed YL,RMA

## 2020-03-06 MED FILL — SERTRALINE HCL 100 MG TAB: 100 | 90 days supply | Qty: 90 | Fill #2

## 2020-03-06 MED FILL — HYDROXYZINE PAM 25 MG CAP: 25 | 10 days supply | Qty: 30 | Fill #2

## 2020-04-03 ENCOUNTER — Ambulatory Visit: Payer: 59 | Admitting: Orthopaedic Surgery

## 2020-04-03 ENCOUNTER — Other Ambulatory Visit: Payer: Self-pay | Admitting: Orthopaedic Surgery

## 2020-04-03 DIAGNOSIS — Z9889 Other specified postprocedural states: Secondary | ICD-10-CM

## 2020-04-03 MED ORDER — TRAMADOL HCL 50 MG PO TABS
50.0000 mg | ORAL_TABLET | Freq: Every day | ORAL | 0 refills | Status: DC | PRN
Start: 2020-04-03 — End: 2020-04-03

## 2020-04-03 MED FILL — traMADol HCL 50 MG TABS: 50 | 30 days supply | Qty: 30 | Fill #0

## 2020-04-03 NOTE — Progress Notes (Signed)
   Post-Op Visit Note   Patient: Cynthia Barron           Date of Birth: October 26, 1965           MRN: [id number] Visit Date: 04/03/2020 PCP: Glendale Chard, MD   Assessment & Plan:  Chief Complaint: No chief complaint on file.  Visit Diagnoses:  1. S/P left rotator cuff repair   2. S/P arthroscopy of left shoulder     Plan:   Cynthia Barron is approximately 66-month status post left rotator cuff repair.  She has completed physical therapy.  She is doing well overall and very pleased.  She has returned back to full duty for a couple weeks and has done well.  She does notice some soreness and discomfort with increased use of the shoulder but otherwise her pain is significantly better and no longer has nighttime pain.  Left shoulder shows fully healed surgical scars.  She has slight amount of limitation of passive range of motion.  Strength is appropriate.  At this point we will release her to activity as tolerated.  She is doing well with full duty.  A small supply of tramadol was refilled to take only as needed.  She will continue to do home exercises.  We will see her back as needed.  Follow-Up Instructions: Return if symptoms worsen or fail to improve.   Orders:  No orders of the defined types were placed in this encounter.  Meds ordered this encounter  Medications  . traMADol (ULTRAM) 50 MG tablet    Sig: Take 1 tablet (50 mg total) by mouth daily as needed.    Dispense:  30 tablet    Refill:  0    Imaging: No results found.  PMFS History: Patient Active Problem List   Diagnosis Date Noted  . Traumatic tear of supraspinatus tendon of left shoulder 05/29/2019  . Tendinopathy of left biceps tendon 05/29/2019  . Chronic bilateral low back pain 05/03/2019  . Palpitations 05/18/2018  . Chronic left-sided low back pain with left-sided sciatica 05/17/2018  . Vertigo 05/17/2018  . IBS 12/27/2007  . Blood in stool 12/27/2007  . ABDOMINAL PAIN, LOWER 12/27/2007   Past  Medical History:  Diagnosis Date  . Seasonal allergies   . Tachycardia     Family History  Problem Relation Age of Onset  . Parkinson's disease Mother   . Diabetes Mother   . Hypertension Mother   . Hyperlipidemia Mother   . Hypertension Father   . Hyperlipidemia Father     No past surgical history on file. Social History   Occupational History  . Not on file  Tobacco Use  . Smoking status: Never Smoker  . Smokeless tobacco: Never Used  Substance and Sexual Activity  . Alcohol use: Not Currently  . Drug use: Never  . Sexual activity: Not on file

## 2020-04-13 ENCOUNTER — Other Ambulatory Visit: Payer: Self-pay | Admitting: Nurse Practitioner

## 2020-04-13 ENCOUNTER — Other Ambulatory Visit: Payer: Self-pay

## 2020-04-13 ENCOUNTER — Ambulatory Visit: Payer: 59 | Admitting: Nurse Practitioner

## 2020-04-13 VITALS — BP 138/90 | HR 102 | Temp 98.3°F | Ht 63.6 in | Wt 169.8 lb

## 2020-04-13 DIAGNOSIS — G4459 Other complicated headache syndrome: Secondary | ICD-10-CM

## 2020-04-13 DIAGNOSIS — R7309 Other abnormal glucose: Secondary | ICD-10-CM

## 2020-04-13 DIAGNOSIS — E782 Mixed hyperlipidemia: Secondary | ICD-10-CM | POA: Diagnosis not present

## 2020-04-13 DIAGNOSIS — K582 Mixed irritable bowel syndrome: Secondary | ICD-10-CM | POA: Diagnosis not present

## 2020-04-13 MED ORDER — SUMATRIPTAN SUCCINATE 50 MG PO TABS: 50.0000 mg | ORAL_TABLET | Freq: Once | ORAL | 2 refills | Status: DC | PRN

## 2020-04-13 MED FILL — SUMAtriptan SUCCINATE 50 MG: 50 | 30 days supply | Qty: 9 | Fill #0

## 2020-04-13 MED FILL — METOPROLOL SUCCINATE ER 25: 25 | 90 days supply | Qty: 90 | Fill #2

## 2020-04-13 NOTE — Progress Notes (Signed)
Rutherford Nail as a scribe for Minette Brine, FNP.,have documented all relevant documentation on the behalf of Minette Brine, FNP,as directed by  Minette Brine, FNP while in the presence of Minette Brine, [place]. This visit occurred during the SARS-CoV-2 public health emergency.  Safety protocols were in place, including screening questions prior to the visit, additional usage of staff PPE, and extensive cleaning of exam room while observing appropriate contact time as indicated for disinfecting solutions.  Subjective:     Patient ID: Cynthia Barron , female    DOB: [birth year] , 55 y.o.   MRN: [id number]   Chief Complaint  Patient presents with  . Hyperlipidemia  . Headache    HPI  Pt is here today for elevated cholesterol, pt admits to being out of her b/p medication for a couple days and just pick the refill up today.   She had vegetable lo mein and fried chicken wings. 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    After eating sometimes will have to have a bowel movement sometimes 4-5 times in a row. She does see Dr. Collene Mares for her GI changes.  Does not take a probiotic.   Wt Readings from Last 3 Encounters: 04/13/20 : 169 lb 12.8 oz (77 kg) 10/23/19 : 160 lb 9.6 oz (72.8 kg) 09/09/19 : 160 lb 12.8 oz (72.9 kg)   Hyperlipidemia This is a chronic problem. The current episode started more than 1 year ago. The problem is uncontrolled. She has no history of chronic renal disease. There are no known factors aggravating her hyperlipidemia. Pertinent negatives include no chest pain. Current antihyperlipidemic treatment includes statins. There are no compliance problems.   Headache  The current episode started more than 1 month ago. The problem occurs daily. The pain is located in the right unilateral region. The quality of the pain is described as aching. Pertinent negatives include no dizziness. Treatments tried: BCs. There is no  history of migraine headaches.     Past Medical History:  Diagnosis Date  . Seasonal allergies   . Tachycardia      Family History  Problem Relation Age of Onset  . Parkinson's disease Mother   . Diabetes Mother   . Hypertension Mother   . Hyperlipidemia Mother   . Hypertension Father   . Hyperlipidemia Father      Current Outpatient Medications:  .  hydrOXYzine (VISTARIL) 25 MG capsule, Take 1 capsule (25 mg total) by mouth 3 (three) times daily as needed., Disp: 30 capsule, Rfl: 2 .  levocetirizine (XYZAL) 5 MG tablet, Take 1 tablet (5 mg total) by mouth every evening., Disp: 90 tablet, Rfl: 2 .  magnesium oxide (MAG-OX) 400 MG tablet, Take 1 tablet (400 mg total) by mouth daily., Disp: 90 tablet, Rfl: 2 .  meloxicam (MOBIC) 7.5 MG tablet, Take 1 tablet (7.5 mg total) by mouth 2 (two) times daily as needed for pain., Disp: 60 tablet, Rfl: 2 .  metoprolol succinate (TOPROL-XL) 25 MG 24 hr tablet, Take 1 tablet (25 mg total) by mouth daily., Disp: 90 tablet, Rfl: 2 .  ondansetron (ZOFRAN) 4 MG tablet, Take 1 tablet (4 mg total) by mouth every 8 (eight) hours as needed for nausea or vomiting., Disp: 40 tablet, Rfl: 0 .  sertraline (ZOLOFT) 100 MG tablet, Take 1 tablet (100 mg total) by mouth daily., Disp: 90 tablet, Rfl: 2 .  SUMAtriptan (IMITREX) 50 MG tablet, Take 1  tablet (50 mg total) by mouth once as needed for migraine. May repeat in 2 hours if headache persists or recurs., Disp: 10 tablet, Rfl: 2 .  traMADol (ULTRAM) 50 MG tablet, Take 1 tablet (50 mg total) by mouth daily as needed., Disp: 30 tablet, Rfl: 0 .  traZODone (DESYREL) 50 MG tablet, Take 1 tablet (50 mg total) by mouth at bedtime., Disp: 30 tablet, Rfl: 2   Allergies  Allergen Reactions  . Dicyclomine Hcl     REACTION: Dizziness  . Penicillins     REACTION: vomiting     Review of Systems  Constitutional: Negative.  Negative for fatigue.  HENT: Negative.   Respiratory: Negative.   Cardiovascular: Negative  for chest pain, palpitations and leg swelling.  Endocrine: Negative for polydipsia, polyphagia and polyuria.  Musculoskeletal: Negative.   Skin: Negative.   Neurological: Positive for headaches (when she wakes up in the morning). Negative for dizziness.  Psychiatric/Behavioral: Negative.      Today's Vitals   04/13/20 1525  BP: 138/90  Pulse: (!) 102  Temp: 98.3 F (36.8 C)  TempSrc: Oral  Weight: 169 lb 12.8 oz (77 kg)  Height: 5' 3.6" (1.615 m)   Body mass index is 29.51 kg/m.  Wt Readings from Last 3 Encounters:  04/13/20 169 lb 12.8 oz (77 kg)  10/23/19 160 lb 9.6 oz (72.8 kg)  09/09/19 160 lb 12.8 oz (72.9 kg)   Objective:  Physical Exam Vitals reviewed.  Constitutional:      General: She is not in acute distress.    Appearance: Normal appearance. She is obese.  HENT:     Head: Normocephalic.     Right Ear: Tympanic membrane, ear canal and external ear normal. There is no impacted cerumen.     Left Ear: Tympanic membrane, ear canal and external ear normal. There is no impacted cerumen.     Nose: Nose normal. No congestion.     Mouth/Throat:     Mouth: Mucous membranes are moist.  Eyes:     Extraocular Movements: Extraocular movements intact.     Conjunctiva/sclera: Conjunctivae normal.     Pupils: Pupils are equal, round, and reactive to light.  Cardiovascular:     Rate and Rhythm: Normal rate and regular rhythm.     Pulses: Normal pulses.     Heart sounds: Normal heart sounds. No murmur heard.   Pulmonary:     Effort: Pulmonary effort is normal. No respiratory distress.     Breath sounds: Normal breath sounds. No wheezing.  Skin:    General: Skin is warm and dry.     Capillary Refill: Capillary refill takes less than 2 seconds.  Neurological:     General: No focal deficit present.     Mental Status: She is alert and oriented to person, place, and time.     Cranial Nerves: No cranial nerve deficit.  Psychiatric:        Mood and Affect: Mood normal.         Behavior: Behavior normal.        Thought Content: Thought content normal.        Judgment: Judgment normal.         Assessment And Plan:     1. Mixed hyperlipidemia  Chronic, stable without medications  Will check levels pending results will consider statin - Lipid panel  2. Other complicated headache syndrome  Chronic, she has been taking BC's will try her on imitrex to see if helps  Avoid  triggers  Keep a headache log - SUMAtriptan (IMITREX) 50 MG tablet; Take 1 tablet (50 mg total) by mouth once as needed for migraine. May repeat in 2 hours if headache persists or recurs.  Dispense: 10 tablet; Refill: 2  3. Irritable bowel syndrome with both constipation and diarrhea  Encouraged to take a probiotic - BMP8+eGFR  4. Abnormal glucose  Chronic, stable  Encouraged to avoid sugary foods and drinks - Hemoglobin A1c   She is encouraged to strive for BMI less than 30 to decrease cardiac risk. Advised to aim for at least 150 minutes of exercise per week.   Patient was given opportunity to ask questions. Patient verbalized understanding of the plan and was able to repeat key elements of the plan. All questions were answered to their satisfaction.  Minette Brine, FNP   I, Minette Brine, FNP, have reviewed all documentation for this visit. The documentation on 04/13/20 for the exam, diagnosis, procedures, and orders are all accurate and complete.  THE PATIENT IS ENCOURAGED TO PRACTICE SOCIAL DISTANCING DUE TO THE COVID-19 PANDEMIC.

## 2020-04-14 ENCOUNTER — Other Ambulatory Visit: Payer: Self-pay | Admitting: Nurse Practitioner

## 2020-04-14 DIAGNOSIS — R7309 Other abnormal glucose: Secondary | ICD-10-CM | POA: Diagnosis not present

## 2020-04-14 DIAGNOSIS — K582 Mixed irritable bowel syndrome: Secondary | ICD-10-CM | POA: Diagnosis not present

## 2020-04-14 DIAGNOSIS — E782 Mixed hyperlipidemia: Secondary | ICD-10-CM | POA: Diagnosis not present

## 2020-04-15 LAB — LIPID PANEL
Chol/HDL Ratio: 4.7 ratio — ABNORMAL HIGH (ref 0.0–4.4)
Cholesterol, Total: 220 mg/dL — ABNORMAL HIGH (ref 100–199)
HDL: 47 mg/dL (ref >39)
LDL Chol Calc (NIH): 148 mg/dL — ABNORMAL HIGH (ref 0–99)
Triglycerides: 138 mg/dL (ref 0–149)
VLDL Cholesterol Cal: 25 mg/dL (ref 5–40)

## 2020-04-15 LAB — BMP8+EGFR
BUN/Creatinine Ratio: 19 (ref 9–23)
BUN: 13 mg/dL (ref 6–24)
CO2: 21 mmol/L (ref 20–29)
Calcium: 9.2 mg/dL (ref 8.7–10.2)
Chloride: 103 mmol/L (ref 96–106)
Creatinine, Ser: 0.67 mg/dL (ref 0.57–1.00)
Glucose: 81 mg/dL (ref 65–99)
Potassium: 4.1 mmol/L (ref 3.5–5.2)
Sodium: 139 mmol/L (ref 134–144)
eGFR: 103 mL/min/{1.73_m2} (ref >59)

## 2020-04-15 LAB — HEMOGLOBIN A1C
Est. average glucose Bld gHb Est-mCnc: 120 mg/dL
Hgb A1c MFr Bld: 5.8 % — ABNORMAL HIGH (ref 4.8–5.6)

## 2020-05-08 ENCOUNTER — Encounter: Payer: Self-pay | Admitting: Nurse Practitioner

## 2020-05-16 ENCOUNTER — Other Ambulatory Visit (HOSPITAL_COMMUNITY): Payer: Self-pay

## 2020-05-18 ENCOUNTER — Other Ambulatory Visit (HOSPITAL_COMMUNITY): Payer: Self-pay

## 2020-05-18 MED FILL — Trazodone HCl Tab 50 MG: ORAL | 30 days supply | Qty: 30 | Fill #0 | Status: AC

## 2020-05-18 MED FILL — Meloxicam Tab 7.5 MG: ORAL | 30 days supply | Qty: 60 | Fill #0 | Status: AC

## 2020-05-18 MED FILL — Sumatriptan Succinate Tab 50 MG: ORAL | 30 days supply | Qty: 10 | Fill #0 | Status: AC

## 2020-05-25 ENCOUNTER — Ambulatory Visit: Payer: 59 | Admitting: Nurse Practitioner

## 2020-06-04 ENCOUNTER — Ambulatory Visit: Payer: 59 | Admitting: Nurse Practitioner

## 2020-06-04 ENCOUNTER — Other Ambulatory Visit: Payer: Self-pay

## 2020-06-04 ENCOUNTER — Other Ambulatory Visit (HOSPITAL_COMMUNITY): Payer: Self-pay

## 2020-06-04 ENCOUNTER — Encounter: Payer: Self-pay | Admitting: Nurse Practitioner

## 2020-06-04 VITALS — BP 122/76 | HR 75 | Temp 97.9°F | Ht 63.6 in | Wt 164.4 lb

## 2020-06-04 DIAGNOSIS — F419 Anxiety disorder, unspecified: Secondary | ICD-10-CM

## 2020-06-04 DIAGNOSIS — K588 Other irritable bowel syndrome: Secondary | ICD-10-CM | POA: Diagnosis not present

## 2020-06-04 DIAGNOSIS — R519 Headache, unspecified: Secondary | ICD-10-CM | POA: Diagnosis not present

## 2020-06-04 DIAGNOSIS — J302 Other seasonal allergic rhinitis: Secondary | ICD-10-CM | POA: Diagnosis not present

## 2020-06-04 LAB — POC COVID19 BINAXNOW: SARS Coronavirus 2 Ag: NEGATIVE

## 2020-06-04 MED ORDER — SUMATRIPTAN SUCCINATE 50 MG PO TABS
ORAL_TABLET | ORAL | 5 refills | Status: DC
  Filled 2020-06-04 – 2020-07-17 (×2): qty 10, 30d supply, fill #0
  Filled 2020-10-13: qty 10, 30d supply, fill #1

## 2020-06-04 MED ORDER — LEVOCETIRIZINE DIHYDROCHLORIDE 5 MG PO TABS
5.0000 mg | ORAL_TABLET | Freq: Every evening | ORAL | 1 refills | Status: AC
  Filled 2020-06-04: qty 90, 90d supply, fill #0

## 2020-06-04 MED ORDER — METOPROLOL SUCCINATE ER 25 MG PO TB24
25.0000 mg | ORAL_TABLET | Freq: Every day | ORAL | 2 refills | Status: DC
  Filled 2020-06-04 – 2020-07-17 (×2): qty 90, 90d supply, fill #0
  Filled 2020-11-20: qty 90, 90d supply, fill #1

## 2020-06-04 MED ORDER — SERTRALINE HCL 100 MG PO TABS
100.0000 mg | ORAL_TABLET | Freq: Every day | ORAL | 1 refills | Status: DC
  Filled 2020-06-04: qty 90, 90d supply, fill #0
  Filled 2020-10-13: qty 90, 90d supply, fill #1

## 2020-06-04 MED ORDER — FLUTICASONE PROPIONATE 50 MCG/ACT NA SUSP
2.0000 | Freq: Every day | NASAL | 2 refills | Status: AC
  Filled 2020-06-04: qty 16, 30d supply, fill #0
  Filled 2020-07-17: qty 16, 30d supply, fill #1

## 2020-06-04 MED ORDER — HYDROXYZINE PAMOATE 25 MG PO CAPS
25.0000 mg | ORAL_CAPSULE | Freq: Three times a day (TID) | ORAL | 5 refills | Status: DC | PRN
  Filled 2020-06-04: qty 30, 10d supply, fill #0
  Filled 2020-10-13: qty 30, 10d supply, fill #1
  Filled 2021-02-12: qty 30, 10d supply, fill #2

## 2020-06-04 NOTE — Progress Notes (Signed)
Rutherford Nail as a scribe for Minette Brine, FNP.,have documented all relevant documentation on the behalf of Minette Brine, FNP,as directed by  Minette Brine, FNP while in the presence of Minette Brine, [place]. This visit occurred during the SARS-CoV-2 public health emergency.  Safety protocols were in place, including screening questions prior to the visit, additional usage of staff PPE, and extensive cleaning of exam room while observing appropriate contact time as indicated for disinfecting solutions.  Subjective:     Patient ID: Cynthia Barron , female    DOB: 1965/12/20 , 55 y.o.   MRN: [id number]   Chief Complaint  Patient presents with  . Follow-up    Imitrex and probiotic    HPI  Pt is here today for a f/u for headache (sumitriptan) and probiotic.  She has taken imitrex couple of times. Headache 2 days ago gradually. Yesterday was not too bad. She has puffiness to her eyes. Sneezing and pain to her right side of face. She has used nasal spray today on her way to the office. She does feel sumitriptan is effective.  She has not taken a covid test.  Headache  This is a recurrent problem. The current episode started yesterday. The problem occurs constantly. The pain is located in the frontal region. The pain does not radiate. The pain quality is similar to prior headaches. The quality of the pain is described as throbbing (pressure behind her eyes). Associated symptoms include rhinorrhea. Pertinent negatives include no abdominal pain, dizziness or numbness.     Past Medical History:  Diagnosis Date  . Seasonal allergies   . Tachycardia      Family History  Problem Relation Age of Onset  . Parkinson's disease Mother   . Diabetes Mother   . Hypertension Mother   . Hyperlipidemia Mother   . Hypertension Father   . Hyperlipidemia Father      Current Outpatient Medications:  .  fluticasone (FLONASE) 50 MCG/ACT nasal spray, Place 2 sprays into both nostrils daily., Disp:  16 g, Rfl: 2 .  magnesium oxide (MAG-OX) 400 MG tablet, Take 1 tablet (400 mg total) by mouth daily., Disp: 90 tablet, Rfl: 2 .  meloxicam (MOBIC) 7.5 MG tablet, TAKE 1 TABLET BY MOUTH TWICE DAILY AS NEEDED FOR PAIN., Disp: 60 tablet, Rfl: 2 .  ondansetron (ZOFRAN) 4 MG tablet, Take 1 tablet (4 mg total) by mouth every 8 (eight) hours as needed for nausea or vomiting., Disp: 40 tablet, Rfl: 0 .  traMADol (ULTRAM) 50 MG tablet, TAKE 1 TABLET (50 MG TOTAL) BY MOUTH DAILY AS NEEDED., Disp: 30 tablet, Rfl: 0 .  traZODone (DESYREL) 50 MG tablet, TAKE 1 TABLET (50 MG TOTAL) BY MOUTH AT BEDTIME., Disp: 30 tablet, Rfl: 2 .  hydrOXYzine (VISTARIL) 25 MG capsule, Take 1 capsule (25 mg total) by mouth 3 (three) times daily as needed., Disp: 30 capsule, Rfl: 5 .  levocetirizine (XYZAL) 5 MG tablet, Take 1 tablet (5 mg total) by mouth every evening., Disp: 90 tablet, Rfl: 1 .  metoprolol succinate (TOPROL-XL) 25 MG 24 hr tablet, Take 1 tablet (25 mg total) by mouth daily., Disp: 90 tablet, Rfl: 2 .  NOREL AD 4-10-325 MG TABS, Take 1 tablet by mouth 2 (two) times daily as needed., Disp: 30 tablet, Rfl: 0 .  sertraline (ZOLOFT) 100 MG tablet, Take 1 tablet (100 mg total) by mouth daily., Disp: 90 tablet, Rfl: 1 .  SUMAtriptan (IMITREX) 50 MG tablet, Take 1 tablet (50 mg total)  by mouth once as needed for migraine. May repeat in 2 hours if headache persists or recurs., Disp: 10 tablet, Rfl: 5   Allergies  Allergen Reactions  . Dicyclomine Hcl     REACTION: Dizziness  . Penicillins     REACTION: vomiting     Review of Systems  Constitutional: Negative.  Negative for fatigue.  HENT: Positive for rhinorrhea.   Eyes:       Puffy eyes  Gastrointestinal: Negative for abdominal pain.  Endocrine: Negative for polydipsia, polyphagia and polyuria.  Musculoskeletal: Negative.   Skin: Negative.   Neurological: Positive for headaches. Negative for dizziness and numbness.  Psychiatric/Behavioral: Negative.       Today's Vitals   06/04/20 1448  BP: 122/76  Pulse: 75  Temp: 97.9 F (36.6 C)  TempSrc: Oral  SpO2: 96%  Weight: 164 lb 6.4 oz (74.6 kg)  Height: 5' 3.6" (1.615 m)   Body mass index is 28.58 kg/m.  Wt Readings from Last 3 Encounters:  06/04/20 164 lb 6.4 oz (74.6 kg)  04/13/20 169 lb 12.8 oz (77 kg)  10/23/19 160 lb 9.6 oz (72.8 kg)   Objective:  Physical Exam Vitals reviewed.  Constitutional:      General: She is not in acute distress.    Appearance: Normal appearance. She is obese.  HENT:     Head: Normocephalic.     Nose: No congestion.     Right Sinus: Frontal sinus tenderness present.     Left Sinus: Frontal sinus tenderness present.     Mouth/Throat:     Mouth: Mucous membranes are moist.  Eyes:     Extraocular Movements: Extraocular movements intact.     Conjunctiva/sclera: Conjunctivae normal.     Pupils: Pupils are equal, round, and reactive to light.     Comments: She has puffiness around her eyes bilaterally  Cardiovascular:     Rate and Rhythm: Normal rate and regular rhythm.     Pulses: Normal pulses.     Heart sounds: Normal heart sounds. No murmur heard.   Pulmonary:     Effort: Pulmonary effort is normal. No respiratory distress.     Breath sounds: Normal breath sounds. No wheezing.  Skin:    General: Skin is warm and dry.     Capillary Refill: Capillary refill takes less than 2 seconds.  Neurological:     General: No focal deficit present.     Mental Status: She is alert and oriented to person, place, and time.     Cranial Nerves: No cranial nerve deficit.  Psychiatric:        Mood and Affect: Mood normal.        Behavior: Behavior normal.        Thought Content: Thought content normal.        Judgment: Judgment normal.         Assessment And Plan:     1. Seasonal allergies  Will check for covid to ensure this is only allergies with the recent strains the symptoms resemble allergies  - levocetirizine (XYZAL) 5 MG tablet; Take 1  tablet (5 mg total) by mouth every evening.  Dispense: 90 tablet; Refill: 1 - fluticasone (FLONASE) 50 MCG/ACT nasal spray; Place 2 sprays into both nostrils daily.  Dispense: 16 g; Refill: 2 - Novel Coronavirus, NAA ([facility]) - POC COVID-19  2. Anxiety  Feels medications have been effective  Continue with hydroxyzine and sertraline  - hydrOXYzine (VISTARIL) 25 MG capsule; Take 1 capsule (25 mg  total) by mouth 3 (three) times daily as needed.  Dispense: 30 capsule; Refill: 5 - sertraline (ZOLOFT) 100 MG tablet; Take 1 tablet (100 mg total) by mouth daily.  Dispense: 90 tablet; Refill: 1  3. Nonintractable episodic headache, unspecified headache type  Tolerating well and responding well to imitrex - SUMAtriptan (IMITREX) 50 MG tablet; Take 1 tablet (50 mg total)  by mouth once as needed for migraine. May repeat in 2 hours if headache persists or recurs.  Dispense: 10 tablet; Refill: 5 - Novel Coronavirus, NAA ([facility]) - POC COVID-19  4. Other irritable bowel syndrome Restora samples given to see if effective   Patient was given opportunity to ask questions. Patient verbalized understanding of the plan and was able to repeat key elements of the plan. All questions were answered to their satisfaction.  Minette Brine, FNP   I, Minette Brine, FNP, have reviewed all documentation for this visit. The documentation on 06/04/20 for the exam, diagnosis, procedures, and orders are all accurate and complete.   IF YOU HAVE BEEN REFERRED TO A SPECIALIST, IT MAY TAKE 1-2 WEEKS TO SCHEDULE/PROCESS THE REFERRAL. IF YOU HAVE NOT HEARD FROM US/SPECIALIST IN TWO WEEKS, PLEASE GIVE [place] A CALL AT [phone] X [phone].   THE PATIENT IS ENCOURAGED TO PRACTICE SOCIAL DISTANCING DUE TO THE COVID-19 PANDEMIC.

## 2020-06-05 LAB — NOVEL CORONAVIRUS, NAA: SARS-CoV-2, NAA: NOT DETECTED

## 2020-06-05 LAB — SARS-COV-2, NAA 2 DAY TAT

## 2020-06-11 ENCOUNTER — Other Ambulatory Visit: Payer: Self-pay

## 2020-06-11 ENCOUNTER — Other Ambulatory Visit (HOSPITAL_COMMUNITY): Payer: Self-pay

## 2020-06-11 DIAGNOSIS — J302 Other seasonal allergic rhinitis: Secondary | ICD-10-CM

## 2020-06-11 MED ORDER — NOREL AD 4-10-325 MG PO TABS
1.0000 | ORAL_TABLET | Freq: Two times a day (BID) | ORAL | 0 refills | Status: DC | PRN
  Filled 2020-06-11: qty 30, 15d supply, fill #0

## 2020-06-12 ENCOUNTER — Other Ambulatory Visit (HOSPITAL_COMMUNITY): Payer: Self-pay

## 2020-07-10 IMAGING — MR MR LUMBAR SPINE W/O CM
4 of 5 series · 26 of 48 positions shown · non-contrast
Comparison: Plain films lumbar spine 03/29/2019.

CLINICAL DATA: Low back pain for 2 years.

EXAM:
MRI LUMBAR SPINE WITHOUT CONTRAST
TECHNIQUE: Multiplanar, multisequence MR imaging of the lumbar spine was
performed. No intravenous contrast was administered.

[Series 2: T2 · sagittal · 4.0mm · 1.09mm/px · 5 of 15 slices shown (1 of 2)]
[im 1/15]
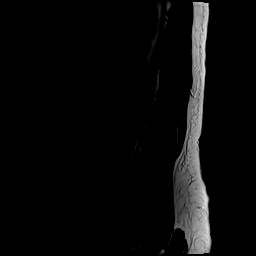
[im 4/15]
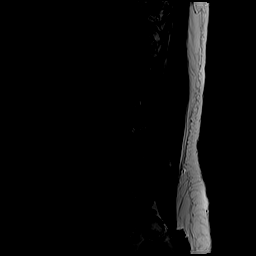
[im 8/15]
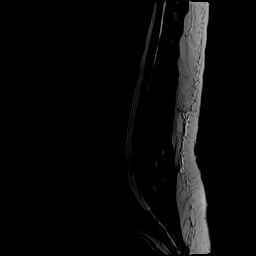
[im 11/15]
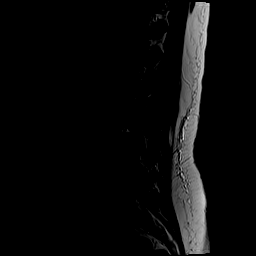
[im 15/15]
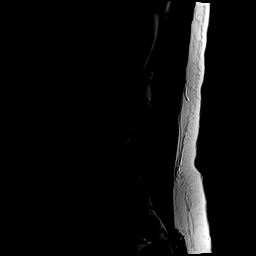

[Series 4: T1 · sagittal · 4.0mm · 1.09mm/px · 6 of 15 slices shown (1 of 2)]
[im 1/15]
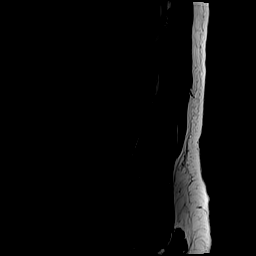
[im 3/15]
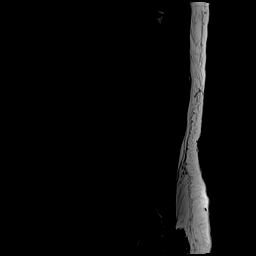
[im 6/15]
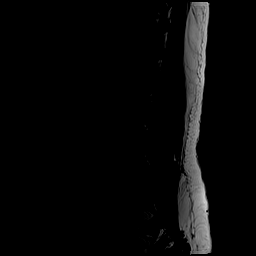
[im 9/15]
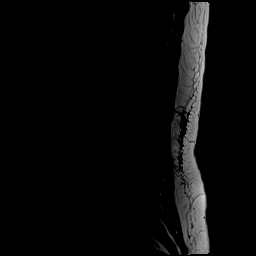
[im 12/15]
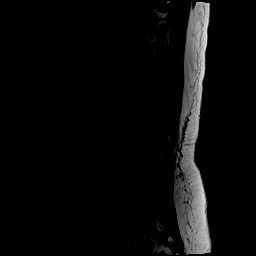
[im 15/15]
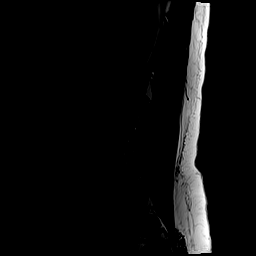

[Series 5: T2 · axial · 4.0mm · 0.39mm/px · z∈[-82,+126]mm · 10 of 42 slices shown (2 of 2)]
[im 3/42]
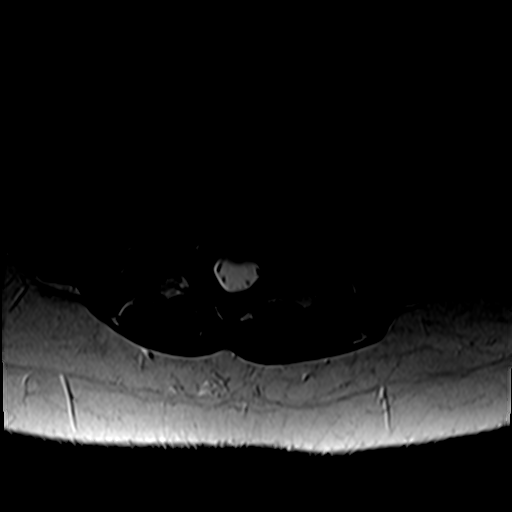
[im 6/42]
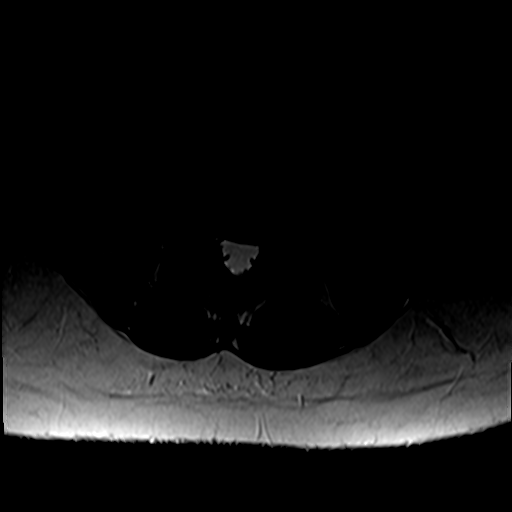
[im 9/42]
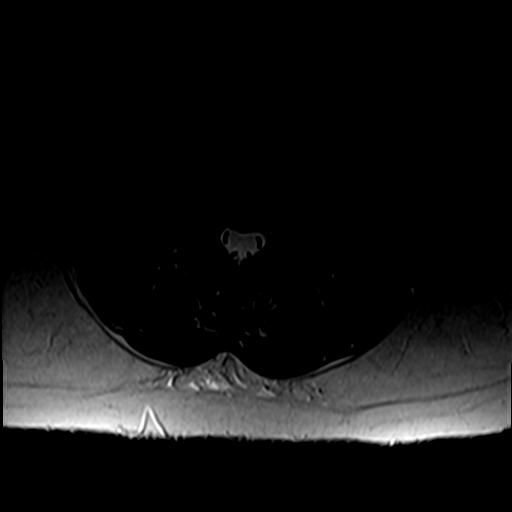
[im 14/42]
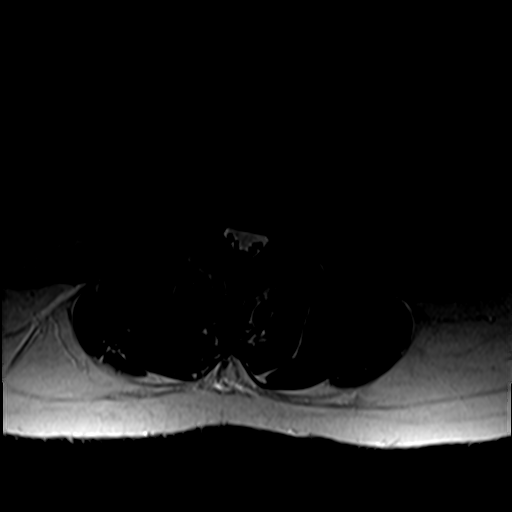
[im 20/42]
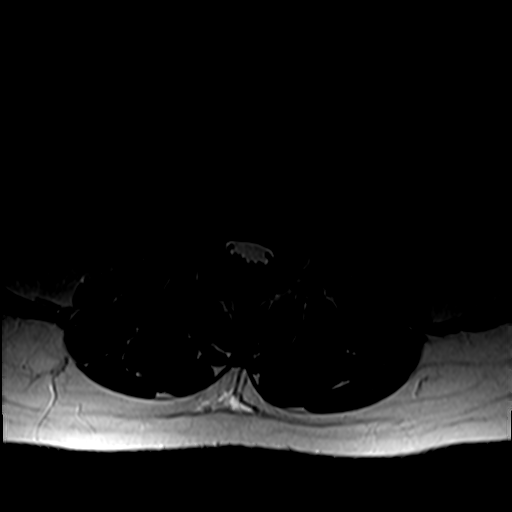
[im 22/42]
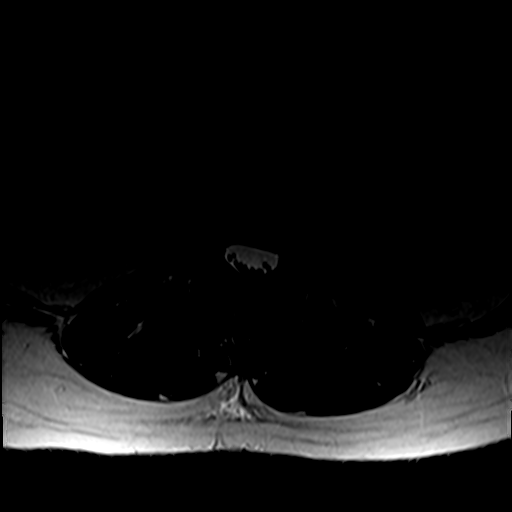
[im 25/42]
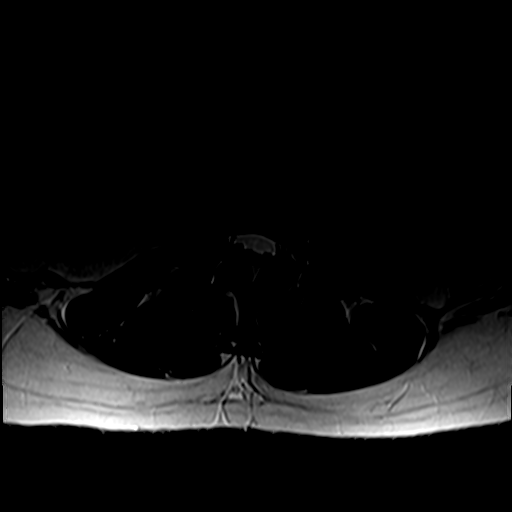
[im 31/42]
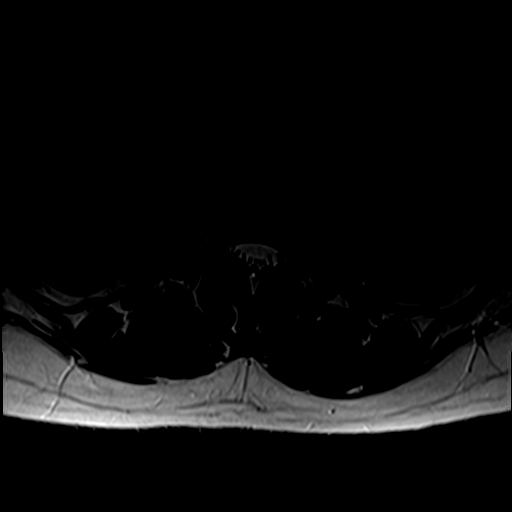
[im 36/42]
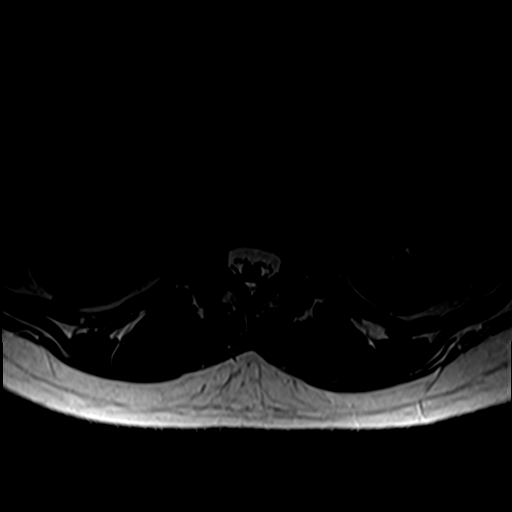
[im 42/42]
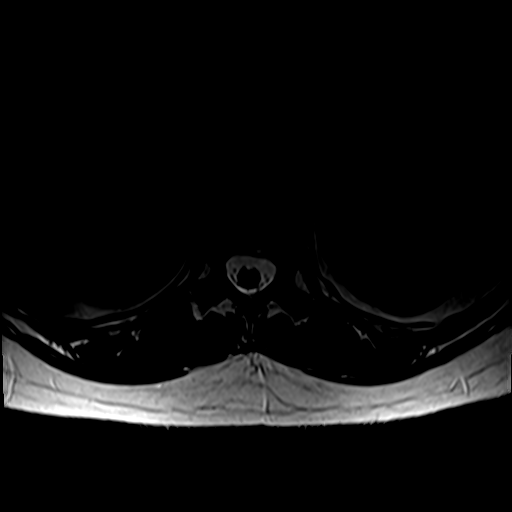

[Series 6: T1 · axial · 4.0mm · 0.39mm/px · z∈[-82,+97]mm · 5 of 42 slices shown (2 of 2)]
[im 3/42]
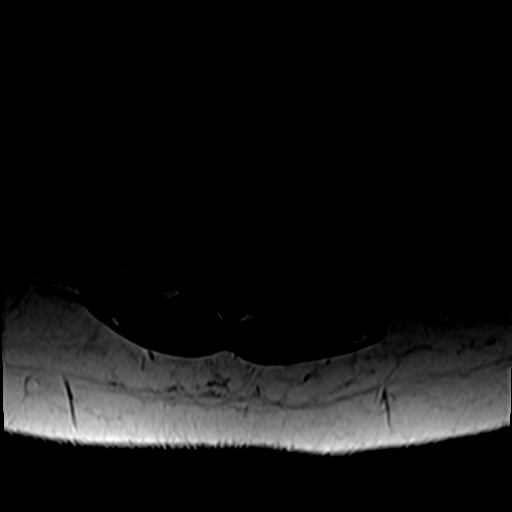
[im 6/42]
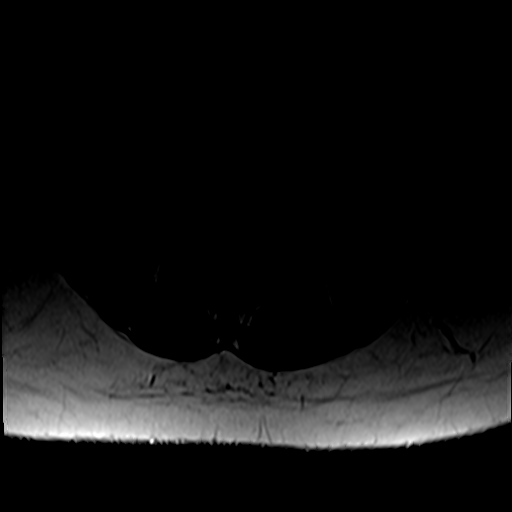
[im 9/42]
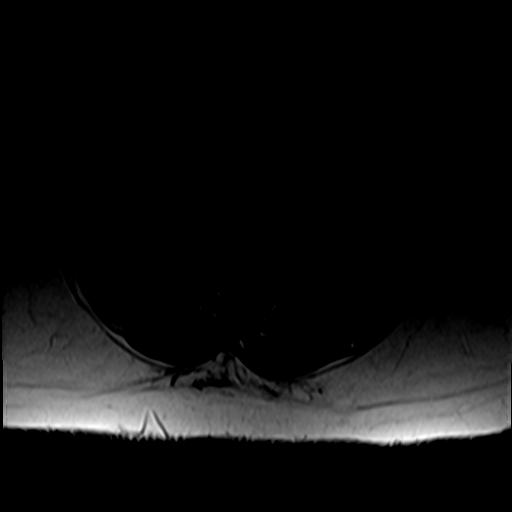
[im 22/42]
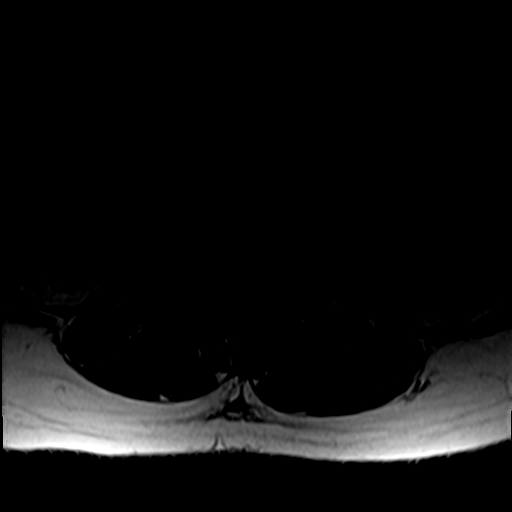
[im 36/42]
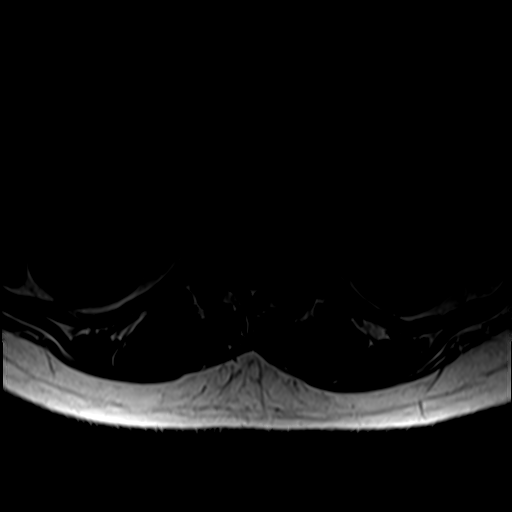

[26 of 48 positions shown; findings below may reference images not displayed]

FINDINGS: Segmentation:  Standard.

Alignment: Mild convex left curvature. Trace anterolisthesis L4 on
L5.

Vertebrae:  No fracture, evidence of discitis, or bone lesion.

Conus medullaris and cauda equina: Conus extends to the L1-2 level.
Conus and cauda equina appear normal.

Paraspinal and other soft tissues: Small cyst right kidney noted.

Disc levels:

T11-12 is imaged in the sagittal plane only and negative.

T12-L1: Negative.

L1-2: Negative.

L2-3: Mild facet degenerative change.  Otherwise negative.

L3-4: Negative.

L4-5: There is a shallow disc bulge, moderate to advanced facet
arthropathy with associated small effusions. Ligamentum flavum
thickening is also seen. Mild to moderate central canal stenosis is
present with narrowing in the subarticular recesses, worse on the
left. Mild bilateral foraminal narrowing is also seen.

L5-S1: Mild-to-moderate facet degenerative disease. The wise
negative.
IMPRESSION: Spondylosis is most notable at L4-5 where there is mild to moderate
central canal stenosis and left greater than right subarticular
recess narrowing which could irritate either descending L5 root.
Moderate to advanced facet degenerative change is also seen at this
level with associated facet effusions.

## 2020-07-17 ENCOUNTER — Other Ambulatory Visit (HOSPITAL_COMMUNITY): Payer: Self-pay

## 2020-09-14 ENCOUNTER — Encounter: Payer: 59 | Admitting: Nurse Practitioner

## 2020-09-14 ENCOUNTER — Encounter: Payer: 59 | Admitting: Internal Medicine

## 2020-10-07 ENCOUNTER — Encounter: Payer: 59 | Admitting: Nurse Practitioner

## 2020-10-14 ENCOUNTER — Other Ambulatory Visit (HOSPITAL_COMMUNITY): Payer: Self-pay

## 2020-10-28 ENCOUNTER — Encounter: Payer: 59 | Admitting: Nurse Practitioner

## 2020-11-19 ENCOUNTER — Encounter: Payer: 59 | Admitting: Nurse Practitioner

## 2020-11-20 ENCOUNTER — Other Ambulatory Visit (HOSPITAL_COMMUNITY): Payer: Self-pay

## 2020-12-30 ENCOUNTER — Other Ambulatory Visit (HOSPITAL_COMMUNITY): Payer: Self-pay

## 2020-12-30 ENCOUNTER — Encounter: Payer: Self-pay | Admitting: Internal Medicine

## 2020-12-30 DIAGNOSIS — Z1231 Encounter for screening mammogram for malignant neoplasm of breast: Secondary | ICD-10-CM | POA: Diagnosis not present

## 2020-12-30 DIAGNOSIS — R319 Hematuria, unspecified: Secondary | ICD-10-CM | POA: Diagnosis not present

## 2020-12-30 DIAGNOSIS — Z01419 Encounter for gynecological examination (general) (routine) without abnormal findings: Secondary | ICD-10-CM | POA: Diagnosis not present

## 2020-12-30 DIAGNOSIS — Z6827 Body mass index (BMI) 27.0-27.9, adult: Secondary | ICD-10-CM | POA: Diagnosis not present

## 2020-12-30 MED ORDER — BIJUVA 1-100 MG PO CAPS
1.0000 | ORAL_CAPSULE | Freq: Every day | ORAL | 6 refills | Status: DC
  Filled 2020-12-30 – 2021-01-04 (×2): qty 30, 30d supply, fill #0

## 2021-01-04 ENCOUNTER — Other Ambulatory Visit (HOSPITAL_COMMUNITY): Payer: Self-pay

## 2021-01-05 LAB — HM PAP SMEAR

## 2021-01-24 ENCOUNTER — Other Ambulatory Visit: Payer: Self-pay

## 2021-01-24 ENCOUNTER — Encounter (HOSPITAL_BASED_OUTPATIENT_CLINIC_OR_DEPARTMENT_OTHER): Payer: Self-pay | Admitting: *Deleted

## 2021-01-24 ENCOUNTER — Emergency Department (HOSPITAL_BASED_OUTPATIENT_CLINIC_OR_DEPARTMENT_OTHER): Payer: 59

## 2021-01-24 ENCOUNTER — Emergency Department (HOSPITAL_BASED_OUTPATIENT_CLINIC_OR_DEPARTMENT_OTHER)
Admission: EM | Admit: 2021-01-24 | Discharge: 2021-01-24 | Disposition: A | Discharge status: Home / Self Care | Payer: 59 | Attending: Emergency Medicine | Admitting: Emergency Medicine

## 2021-01-24 DIAGNOSIS — R42 Dizziness and giddiness: Secondary | ICD-10-CM | POA: Diagnosis not present

## 2021-01-24 DIAGNOSIS — R9431 Abnormal electrocardiogram [ECG] [EKG]: Secondary | ICD-10-CM | POA: Diagnosis not present

## 2021-01-24 DIAGNOSIS — R030 Elevated blood-pressure reading, without diagnosis of hypertension: Secondary | ICD-10-CM | POA: Diagnosis not present

## 2021-01-24 DIAGNOSIS — H81399 Other peripheral vertigo, unspecified ear: Secondary | ICD-10-CM | POA: Insufficient documentation

## 2021-01-24 LAB — CBC WITH DIFFERENTIAL/PLATELET
Abs Immature Granulocytes: 0.01 10*3/uL (ref 0.00–0.07)
Basophils Absolute: 0.1 10*3/uL (ref 0.0–0.1)
Basophils Relative: 1 %
Eosinophils Absolute: 0.2 10*3/uL (ref 0.0–0.5)
Eosinophils Relative: 3 %
HCT: 38.3 % (ref 36.0–46.0)
Hemoglobin: 12.2 g/dL (ref 12.0–15.0)
Immature Granulocytes: 0 %
Lymphocytes Relative: 41 %
Lymphs Abs: 2.1 10*3/uL (ref 0.7–4.0)
MCH: 29.1 pg (ref 26.0–34.0)
MCHC: 31.9 g/dL (ref 30.0–36.0)
MCV: 91.4 fL (ref 80.0–100.0)
Monocytes Absolute: 0.6 10*3/uL (ref 0.1–1.0)
Monocytes Relative: 11 %
Neutro Abs: 2.2 10*3/uL (ref 1.7–7.7)
Neutrophils Relative %: 44 %
Platelets: 259 10*3/uL (ref 150–400)
RBC: 4.19 MIL/uL (ref 3.87–5.11)
RDW: 13.1 % (ref 11.5–15.5)
WBC: 5.1 10*3/uL (ref 4.0–10.5)
nRBC: 0 % (ref 0.0–0.2)

## 2021-01-24 LAB — COMPREHENSIVE METABOLIC PANEL
ALT: 15 U/L (ref 0–44)
AST: 24 U/L (ref 15–41)
Albumin: 4.5 g/dL (ref 3.5–5.0)
Alkaline Phosphatase: 83 U/L (ref 38–126)
Anion gap: 8 (ref 5–15)
BUN: 15 mg/dL (ref 6–20)
CO2: 24 mmol/L (ref 22–32)
Calcium: 9.3 mg/dL (ref 8.9–10.3)
Chloride: 107 mmol/L (ref 98–111)
Creatinine, Ser: 0.75 mg/dL (ref 0.44–1.00)
GFR, Estimated: >60 mL/min (ref >60)
Glucose, Bld: 120 mg/dL — ABNORMAL HIGH (ref 70–99)
Potassium: 4 mmol/L (ref 3.5–5.1)
Sodium: 139 mmol/L (ref 135–145)
Total Bilirubin: 0.3 mg/dL (ref 0.3–1.2)
Total Protein: 7.4 g/dL (ref 6.5–8.1)

## 2021-01-24 MED ORDER — MECLIZINE HCL 25 MG PO TABS: 25.0000 mg | ORAL_TABLET | Freq: Three times a day (TID) | ORAL | 0 refills | Status: DC | PRN

## 2021-01-24 MED ORDER — MECLIZINE HCL 25 MG PO TABS
25.0000 mg | ORAL_TABLET | Freq: Once | ORAL | Status: AC
  Administered 2021-01-24: 25 mg via ORAL
  Filled 2021-01-24: qty 1

## 2021-01-24 NOTE — ED Provider Notes (Signed)
[facility] DEPT Provider Note   CSN: [id number] Arrival date & time: 01/24/21  5809     History Chief Complaint  Patient presents with   Dizziness    Cynthia Barron is a 55 y.o. female.  The history is provided by the patient.  Dizziness Has she has history of tachycardia for which she takes metoprolol, and comes in because of dizziness for the last 3 weeks.  Dizziness is described as a slight unsteady feeling which is worse with turning her head from side to side.  She sometimes feels off balance, but has not stumbled or fallen.  She denies a spinning sensation.  She denies headache or nausea or vomiting.  She denies ear pain or tinnitus or hearing loss.  Symptoms have been getting worse.  Tonight, she had her blood pressure checked several times while at work, and it was significantly higher than it normally runs with blood pressures as high as 983 systolic and as high as 382 diastolic.  She has not tried any treatment at home.   Past Medical History:  Diagnosis Date   Seasonal allergies    Tachycardia     Patient Active Problem List   Diagnosis Date Noted   Traumatic tear of supraspinatus tendon of left shoulder 05/29/2019   Tendinopathy of left biceps tendon 05/29/2019   Chronic bilateral low back pain 05/03/2019   Palpitations 05/18/2018   Chronic left-sided low back pain with left-sided sciatica 05/17/2018   Vertigo 05/17/2018   IBS 12/27/2007   Blood in stool 12/27/2007   ABDOMINAL PAIN, LOWER 12/27/2007    Past Surgical History:  Procedure Laterality Date   SHOULDER SURGERY       OB History   No obstetric history on file.     Family History  Problem Relation Age of Onset   Parkinson's disease Mother    Diabetes Mother    Hypertension Mother    Hyperlipidemia Mother    Hypertension Father    Hyperlipidemia Father     Social History   Tobacco Use   Smoking status: Never   Smokeless tobacco: Never  Substance Use Topics    Alcohol use: Not Currently   Drug use: Never    Home Medications Prior to Admission medications   Medication Sig Start Date End Date Taking? Authorizing Provider  Estradiol-Progesterone (BIJUVA) 1-100 MG CAPS Take 1 capsule by mouth at bedtime. 12/30/20     fluticasone (FLONASE) 50 MCG/ACT nasal spray Place 2 sprays into both nostrils daily. 06/04/20   Minette Brine, FNP  hydrOXYzine (VISTARIL) 25 MG capsule Take 1 capsule (25 mg total) by mouth 3 (three) times daily as needed. 06/04/20   Minette Brine, FNP  levocetirizine (XYZAL) 5 MG tablet Take 1 tablet (5 mg total) by mouth every evening. 06/04/20   Minette Brine, FNP  magnesium oxide (MAG-OX) 400 MG tablet Take 1 tablet (400 mg total) by mouth daily. 04/29/19   Glendale Chard, MD  meloxicam (MOBIC) 7.5 MG tablet TAKE 1 TABLET BY MOUTH TWICE DAILY AS NEEDED FOR PAIN. 01/31/20 01/30/21  Leandrew Koyanagi, MD  metoprolol succinate (TOPROL-XL) 25 MG 24 hr tablet Take 1 tablet (25 mg total) by mouth daily. 06/04/20   Minette Brine, FNP  NOREL AD 4-10-325 MG TABS Take 1 tablet by mouth 2 (two) times daily as needed. 06/11/20   Minette Brine, FNP  ondansetron (ZOFRAN) 4 MG tablet Take 1 tablet (4 mg total) by mouth every 8 (eight) hours as needed for nausea or  vomiting. 09/24/19   Aundra Dubin, PA-C  sertraline (ZOLOFT) 100 MG tablet Take 1 tablet (100 mg total) by mouth daily. 06/04/20   Minette Brine, FNP  SUMAtriptan (IMITREX) 50 MG tablet Take 1 tablet (50 mg total)  by mouth once as needed for migraine. May repeat in 2 hours if headache persists or recurs. 06/04/20   Minette Brine, FNP  traZODone (DESYREL) 50 MG tablet TAKE 1 TABLET (50 MG TOTAL) BY MOUTH AT BEDTIME. 09/09/19 09/08/20  Minette Brine, FNP    Allergies    Dicyclomine hcl and Penicillins  Review of Systems   Review of Systems  Neurological:  Positive for dizziness.  All other systems reviewed and are negative.  Physical Exam Updated Vital Signs BP (!) 151/108   Pulse 72    Temp 98.2 F (36.8 C) (Oral)   Resp 20   Ht 5\' 4"  (1.626 m)   Wt 72.6 kg   LMP  (LMP Unknown) Comment: DECEMBER 2019  SpO2 97%   BMI 27.46 kg/m   Physical Exam Vitals and nursing note reviewed.  55 year old female, resting comfortably and in no acute distress. Vital signs are significant for elevated blood pressure. Oxygen saturation is 97%, which is normal. Head is normocephalic and atraumatic. PERRLA, EOMI. Oropharynx is clear. Neck is nontender and supple without adenopathy or JVD.  There are no carotid bruits. Back is nontender and there is no CVA tenderness. Lungs are clear without rales, wheezes, or rhonchi. Chest is nontender. Heart has regular rate and rhythm without murmur. Abdomen is soft, flat, nontender. Extremities have no cyanosis or edema, full range of motion is present. Skin is warm and dry without rash. Neurologic: Mental status is normal, cranial nerves are intact, strength is 5/5 in all 4 extremities.  There is no pronator drift.  Finger-nose testing is normal without any evidence of ataxia.  Romberg test is normal.  There is no nystagmus.  Dizziness is reproduced by passive head movement.  ED Results / Procedures / Treatments   Labs (all labs ordered are listed, but only abnormal results are displayed) Labs Reviewed  COMPREHENSIVE METABOLIC PANEL - Abnormal; Notable for the following components:      Result Value   Glucose, Bld 120 (*)    All other components within normal limits  CBC WITH DIFFERENTIAL/PLATELET    EKG EKG Interpretation  Date/Time:  Sunday January 24 2021 03:51:10 EST Ventricular Rate:  73 PR Interval:  141 QRS Duration: 82 QT Interval:  394 QTC Calculation: 435 R Axis:   13 Text Interpretation: Age not entered, assumed to be  55 years old for purpose of ECG interpretation Sinus rhythm Normal ECG No old tracing to compare Confirmed by Delora Fuel ([id number]) on 01/24/2021 4:13:32 AM  Radiology CT Head Wo Contrast  Result Date:  01/24/2021 CLINICAL DATA:  Vertigo. EXAM: CT HEAD WITHOUT CONTRAST TECHNIQUE: Contiguous axial images were obtained from the base of the skull through the vertex without intravenous contrast. COMPARISON:  None. FINDINGS: Brain: No evidence of acute infarction, hemorrhage, hydrocephalus, extra-axial collection or mass lesion/mass effect. Vascular: No hyperdense vessel or unexpected calcification. Skull: Normal. Negative for fracture or focal lesion. Sinuses/Orbits: No acute finding. Other: None. IMPRESSION: No acute intracranial abnormality seen. Electronically Signed   By: Marijo Conception M.D.   On: 01/24/2021 05:03    Procedures Procedures   Medications Ordered in ED Medications  meclizine (ANTIVERT) tablet 25 mg (25 mg Oral Given 01/24/21 0436)    ED Course  I have reviewed the triage vital signs and the nursing notes.  Pertinent labs & imaging results that were available during my care of the patient were reviewed by me and considered in my medical decision making (see chart for details).    MDM Rules/Calculators/A&P                         Vertigo which appears to be peripheral vertigo.  Elevated blood pressure.  Blood pressure has come down significantly with simple observation.  Old records are reviewed, and office visits have generally showed normal blood pressure readings.  At this point, I do not see any evidence of stroke, but will send for CT of head.  We will check screening labs and give a therapeutic trial of meclizine.  Labs are normal, CT of head shows no acute findings.  Specifically, no evidence of recent stroke.  Following meclizine, she has noted significant improvement in her dizziness.  Blood pressure has remained only minimally elevated or normal.  She is discharged with prescription for meclizine, follow-up with PCP.  If symptoms are persisting, will need evaluation by neurology.  She is given neurology referral.  Final Clinical Impression(s) / ED Diagnoses Final  diagnoses:  Peripheral vertigo, unspecified laterality    Rx / DC Orders ED Discharge Orders          Ordered    meclizine (ANTIVERT) 25 MG tablet  3 times daily PRN        01/24/21 2694             Delora Fuel, MD 85/46/27 [id number]

## 2021-01-24 NOTE — ED Triage Notes (Addendum)
Pt states that she was at work [employer]. States dizzy when she turns her head from side to side. States intermittent dizziness has been going on for the past 3 days. Checked her b/p at work and it was 162/100. Denies any dizziness at present. Denies HTN history. C/o blurred vision 2-3 times for the past 3 weeks. Denies any cp/sob . Pt states she has been under a lot of stress due to recent deaths in her family.

## 2021-01-24 NOTE — ED Notes (Signed)
Pt reports decreased dizziness, able to rotate head back and forth without complication

## 2021-01-24 NOTE — Discharge Instructions (Signed)
Return if symptoms are worsening.

## 2021-02-01 DIAGNOSIS — R319 Hematuria, unspecified: Secondary | ICD-10-CM | POA: Diagnosis not present

## 2021-02-03 ENCOUNTER — Other Ambulatory Visit (HOSPITAL_COMMUNITY): Payer: Self-pay

## 2021-02-03 MED ORDER — PROGESTERONE MICRONIZED 100 MG PO CAPS
100.0000 mg | ORAL_CAPSULE | Freq: Every day | ORAL | 3 refills | Status: DC
  Filled 2021-02-03: qty 90, 90d supply, fill #0
  Filled 2021-07-19: qty 90, 90d supply, fill #1

## 2021-02-03 MED ORDER — ESTRADIOL 1 MG PO TABS
1.0000 mg | ORAL_TABLET | Freq: Every day | ORAL | 3 refills | Status: DC
  Filled 2021-02-03: qty 90, 90d supply, fill #0
  Filled 2021-07-19: qty 90, 90d supply, fill #1

## 2021-02-10 ENCOUNTER — Ambulatory Visit: Payer: 59 | Admitting: Nurse Practitioner

## 2021-02-10 ENCOUNTER — Other Ambulatory Visit (HOSPITAL_COMMUNITY): Payer: Self-pay

## 2021-02-10 ENCOUNTER — Other Ambulatory Visit: Payer: Self-pay

## 2021-02-10 VITALS — BP 130/78 | HR 90 | Temp 98.3°F | Ht 63.6 in | Wt 167.0 lb

## 2021-02-10 DIAGNOSIS — R42 Dizziness and giddiness: Secondary | ICD-10-CM | POA: Diagnosis not present

## 2021-02-10 DIAGNOSIS — R000 Tachycardia, unspecified: Secondary | ICD-10-CM | POA: Diagnosis not present

## 2021-02-10 DIAGNOSIS — R030 Elevated blood-pressure reading, without diagnosis of hypertension: Secondary | ICD-10-CM | POA: Diagnosis not present

## 2021-02-10 MED ORDER — METOPROLOL SUCCINATE ER 25 MG PO TB24
25.0000 mg | ORAL_TABLET | Freq: Every day | ORAL | 2 refills | Status: DC
  Filled 2021-02-10: qty 90, 90d supply, fill #0

## 2021-02-10 NOTE — Patient Instructions (Signed)

## 2021-02-10 NOTE — Progress Notes (Signed)
This visit occurred during the SARS-CoV-2 public health emergency.  Safety protocols were in place, including screening questions prior to the visit, additional usage of staff PPE, and extensive cleaning of exam room while observing appropriate contact time as indicated for disinfecting solutions.  Subjective:     Patient ID: Cynthia Barron , female    DOB: [birth year] , 55 y.o.   MRN: [id number]   Chief Complaint  Patient presents with   Follow-up    HPI  Patient is here for a ED follow up. She went to the ED for dizziness. Her blood pressure was elevated at that visit at 151/108. She states that she has been super stressed with the passing of her mom in October and two of her aunts within the month. She believes it was due to stress.  She does feel better, the meclizine did help with her dizziness. Denies SOB, chest pain.     Past Medical History:  Diagnosis Date   Seasonal allergies    Tachycardia      Family History  Problem Relation Age of Onset   Parkinson's disease Mother    Diabetes Mother    Hypertension Mother    Hyperlipidemia Mother    Hypertension Father    Hyperlipidemia Father      Current Outpatient Medications:    estradiol (ESTRACE) 1 MG tablet, Take 1 tablet (1 mg total) by mouth daily., Disp: 90 tablet, Rfl: 3   Estradiol-Progesterone (BIJUVA) 1-100 MG CAPS, Take 1 capsule by mouth at bedtime., Disp: 30 capsule, Rfl: 6   fluticasone (FLONASE) 50 MCG/ACT nasal spray, Place 2 sprays into both nostrils daily., Disp: 16 g, Rfl: 2   hydrOXYzine (VISTARIL) 25 MG capsule, Take 1 capsule (25 mg total) by mouth 3 (three) times daily as needed., Disp: 30 capsule, Rfl: 5   levocetirizine (XYZAL) 5 MG tablet, Take 1 tablet (5 mg total) by mouth every evening., Disp: 90 tablet, Rfl: 1   magnesium oxide (MAG-OX) 400 MG tablet, Take 1 tablet (400 mg total) by mouth daily., Disp: 90 tablet, Rfl: 2   meclizine (ANTIVERT) 25 MG tablet, Take 1 tablet (25 mg total) by  mouth 3 (three) times daily as needed for dizziness., Disp: 30 tablet, Rfl: 0   NOREL AD 4-10-325 MG TABS, Take 1 tablet by mouth 2 (two) times daily as needed., Disp: 30 tablet, Rfl: 0   progesterone (PROMETRIUM) 100 MG capsule, Take 1 capsule (100 mg total) by mouth at bedtime., Disp: 90 capsule, Rfl: 3   sertraline (ZOLOFT) 100 MG tablet, Take 1 tablet (100 mg total) by mouth daily., Disp: 90 tablet, Rfl: 1   SUMAtriptan (IMITREX) 50 MG tablet, Take 1 tablet (50 mg total)  by mouth once as needed for migraine. May repeat in 2 hours if headache persists or recurs., Disp: 10 tablet, Rfl: 5   metoprolol succinate (TOPROL-XL) 25 MG 24 hr tablet, Take 1 tablet (25 mg total) by mouth daily., Disp: 90 tablet, Rfl: 2   traZODone (DESYREL) 50 MG tablet, TAKE 1 TABLET (50 MG TOTAL) BY MOUTH AT BEDTIME., Disp: 30 tablet, Rfl: 2   Allergies  Allergen Reactions   Dicyclomine Hcl     REACTION: Dizziness   Penicillins     REACTION: vomiting     Review of Systems  Constitutional: Negative.  Negative for chills.  Respiratory: Negative.  Negative for cough, chest tightness, shortness of breath and wheezing.   Cardiovascular: Negative.  Negative for chest pain and palpitations.  Gastrointestinal:  Negative for diarrhea and nausea.  Neurological: Negative.  Negative for dizziness, weakness and headaches.    Today's Vitals   02/10/21 1608  BP: 130/78  Pulse: 90  Temp: 98.3 F (36.8 C)  TempSrc: Oral  Weight: 167 lb (75.8 kg)  Height: 5' 3.6" (1.615 m)   Body mass index is 29.03 kg/m.   Objective:  Physical Exam Constitutional:      Appearance: Normal appearance.  Cardiovascular:     Rate and Rhythm: Normal rate and regular rhythm.     Pulses: Normal pulses.     Heart sounds: Normal heart sounds. No murmur heard. Pulmonary:     Effort: Pulmonary effort is normal. No respiratory distress.     Breath sounds: Normal breath sounds. No wheezing.  Skin:    General: Skin is warm and dry.      Capillary Refill: Capillary refill takes less than 2 seconds.  Neurological:     Mental Status: She is alert.        Assessment And Plan:     1. Elevated blood-pressure reading, without diagnosis of hypertension -Advised pt. To continue to take her BP at home and we will reevaluate in 1 month for a follow up. -Encouraged patient to reduce stress and work on relaxation techniques  -Stay hydrated  -Pt will call with elevated BP reading -pt. Would like to try lifestyle modification first before BP med.  -pt verbalized understanding of the treatment plan.   2. Tachycardia - metoprolol succinate (TOPROL-XL) 25 MG 24 hr tablet; Take 1 tablet (25 mg total) by mouth daily.  Dispense: 90 tablet; Refill: 2  3. Vertigo  -Continue meclizine as needed.   The patient was encouraged to call or send a message through [place] for any questions or concerns.   Follow up: if symptoms persist or do not get better.   Side effects and appropriate use of all the medication(s) were discussed with the patient today. Patient advised to use the medication(s) as directed by their healthcare provider. The patient was encouraged to read, review, and understand all associated package inserts and contact our office with any questions or concerns. The patient accepts the risks of the treatment plan and had an opportunity to ask questions.   Patient was given opportunity to ask questions. Patient verbalized understanding of the plan and was able to repeat key elements of the plan. All questions were answered to their satisfaction.  Raman British Moyd, DNP   I, Raman Delylah Stanczyk have reviewed all documentation for this visit. The documentation on 02/10/21 for the exam, diagnosis, procedures, and orders are all accurate and complete.    IF YOU HAVE BEEN REFERRED TO A SPECIALIST, IT MAY TAKE 1-2 WEEKS TO SCHEDULE/PROCESS THE REFERRAL. IF YOU HAVE NOT HEARD FROM US/SPECIALIST IN TWO WEEKS, PLEASE GIVE [place] A CALL AT [phone] X  [phone].   THE PATIENT IS ENCOURAGED TO PRACTICE SOCIAL DISTANCING DUE TO THE COVID-19 PANDEMIC.

## 2021-02-12 ENCOUNTER — Other Ambulatory Visit (HOSPITAL_COMMUNITY): Payer: Self-pay

## 2021-02-12 ENCOUNTER — Other Ambulatory Visit: Payer: Self-pay | Admitting: Nurse Practitioner

## 2021-02-12 DIAGNOSIS — F419 Anxiety disorder, unspecified: Secondary | ICD-10-CM

## 2021-02-12 MED ORDER — SERTRALINE HCL 100 MG PO TABS
100.0000 mg | ORAL_TABLET | Freq: Every day | ORAL | 1 refills | Status: DC
  Filled 2021-02-12: qty 90, 90d supply, fill #0

## 2021-03-15 ENCOUNTER — Encounter: Payer: Self-pay | Admitting: Nurse Practitioner

## 2021-03-15 ENCOUNTER — Other Ambulatory Visit: Payer: Self-pay

## 2021-03-15 ENCOUNTER — Ambulatory Visit: Payer: 59 | Admitting: Nurse Practitioner

## 2021-03-15 VITALS — BP 126/80 | Temp 98.0°F | Ht 63.6 in | Wt 161.2 lb

## 2021-03-15 DIAGNOSIS — R030 Elevated blood-pressure reading, without diagnosis of hypertension: Secondary | ICD-10-CM

## 2021-03-15 NOTE — Progress Notes (Signed)
I,Yamilka J Llittleton,acting as a Education administrator for [employer], FNP.,have documented all relevant documentation on the behalf of Minette Brine, FNP,as directed by  Minette Brine, FNP while in the presence of Minette Brine, [place].   This visit occurred during the SARS-CoV-2 public health emergency.  Safety protocols were in place, including screening questions prior to the visit, additional usage of staff PPE, and extensive cleaning of exam room while observing appropriate contact time as indicated for disinfecting solutions.  Subjective:     Patient ID: Cynthia Barron , female    DOB: 1965-04-07 , 56 y.o.   MRN: [id number]   Chief Complaint  Patient presents with   Hypertension    HPI  Patient presents today for a bp recheck. She seen Raman NP back in Dec. She reports she had two episodes of dizziness since her last visit one month ago.   Wt Readings from Last 3 Encounters: 03/15/21 : 161 lb 3.2 oz (73.1 kg) 02/10/21 : 167 lb (75.8 kg) 01/24/21 : 160 lb (72.6 kg)    Hypertension Pertinent negatives include no chest pain, headaches or palpitations.    Past Medical History:  Diagnosis Date   Seasonal allergies    Tachycardia      Family History  Problem Relation Age of Onset   Parkinson's disease Mother    Diabetes Mother    Hypertension Mother    Hyperlipidemia Mother    Hypertension Father    Hyperlipidemia Father      Current Outpatient Medications:    estradiol (ESTRACE) 1 MG tablet, Take 1 tablet (1 mg total) by mouth daily., Disp: 90 tablet, Rfl: 3   Estradiol-Progesterone (BIJUVA) 1-100 MG CAPS, Take 1 capsule by mouth at bedtime., Disp: 30 capsule, Rfl: 6   fluticasone (FLONASE) 50 MCG/ACT nasal spray, Place 2 sprays into both nostrils daily., Disp: 16 g, Rfl: 2   hydrOXYzine (VISTARIL) 25 MG capsule, Take 1 capsule (25 mg total) by mouth 3 (three) times daily as needed., Disp: 30 capsule, Rfl: 5   levocetirizine (XYZAL) 5 MG tablet, Take 1 tablet (5 mg total) by  mouth every evening., Disp: 90 tablet, Rfl: 1   magnesium oxide (MAG-OX) 400 MG tablet, Take 1 tablet (400 mg total) by mouth daily., Disp: 90 tablet, Rfl: 2   meclizine (ANTIVERT) 25 MG tablet, Take 1 tablet (25 mg total) by mouth 3 (three) times daily as needed for dizziness., Disp: 30 tablet, Rfl: 0   metoprolol succinate (TOPROL-XL) 25 MG 24 hr tablet, Take 1 tablet (25 mg total) by mouth daily., Disp: 90 tablet, Rfl: 2   NOREL AD 4-10-325 MG TABS, Take 1 tablet by mouth 2 (two) times daily as needed., Disp: 30 tablet, Rfl: 0   progesterone (PROMETRIUM) 100 MG capsule, Take 1 capsule (100 mg total) by mouth at bedtime., Disp: 90 capsule, Rfl: 3   sertraline (ZOLOFT) 100 MG tablet, Take 1 tablet (100 mg total) by mouth daily., Disp: 90 tablet, Rfl: 1   SUMAtriptan (IMITREX) 50 MG tablet, Take 1 tablet (50 mg total)  by mouth once as needed for migraine. May repeat in 2 hours if headache persists or recurs., Disp: 10 tablet, Rfl: 5   traZODone (DESYREL) 50 MG tablet, TAKE 1 TABLET (50 MG TOTAL) BY MOUTH AT BEDTIME., Disp: 30 tablet, Rfl: 2   Allergies  Allergen Reactions   Dicyclomine Hcl     REACTION: Dizziness   Penicillins     REACTION: vomiting     Review of Systems  Constitutional: Negative.  Negative for fatigue.  HENT: Negative.    Respiratory: Negative.    Cardiovascular:  Negative for chest pain, palpitations and leg swelling.  Endocrine: Negative for polydipsia, polyphagia and polyuria.  Musculoskeletal: Negative.   Skin: Negative.   Neurological:  Negative for dizziness and headaches.  Psychiatric/Behavioral: Negative.      Today's Vitals   03/15/21 1428  BP: 126/80  Temp: 98 F (36.7 C)  Weight: 161 lb 3.2 oz (73.1 kg)  Height: 5' 3.6" (1.615 m)  PainSc: 0-No pain   Body mass index is 28.02 kg/m.   Objective:  Physical Exam Vitals reviewed.  Constitutional:      General: She is not in acute distress.    Appearance: Normal appearance.  HENT:     Head:  Normocephalic.     Nose: No congestion.  Eyes:     Pupils: Pupils are equal, round, and reactive to light.  Cardiovascular:     Rate and Rhythm: Normal rate and regular rhythm.     Pulses: Normal pulses.     Heart sounds: Normal heart sounds. No murmur heard. Pulmonary:     Effort: Pulmonary effort is normal. No respiratory distress.     Breath sounds: Normal breath sounds. No wheezing.  Skin:    General: Skin is warm and dry.     Capillary Refill: Capillary refill takes less than 2 seconds.  Neurological:     General: No focal deficit present.     Mental Status: She is alert and oriented to person, place, and time.     Cranial Nerves: No cranial nerve deficit.     Motor: No weakness.  Psychiatric:        Mood and Affect: Mood normal.        Behavior: Behavior normal.        Thought Content: Thought content normal.        Judgment: Judgment normal.        Assessment And Plan:     1. Elevated blood-pressure reading, without diagnosis of hypertension Comments: Blood pressure is better controlled, she is on metoprolol for her heart rate and has noticed at times if she misses she will feel "funny". At this time will not start any other medications but she is encouraged to take her blood pressure 3 times a week and keep a log. She is to notate if she has taken her metoprolol or not.   She would like to wait until March at her Physical for her Shingrix vaccine. I have also encouraged to take the covid booster.      Patient was given opportunity to ask questions. Patient verbalized understanding of the plan and was able to repeat key elements of the plan. All questions were answered to their satisfaction.  Minette Brine, FNP   I, Minette Brine, FNP, have reviewed all documentation for this visit. The documentation on 03/15/21 for the exam, diagnosis, procedures, and orders are all accurate and complete.   IF YOU HAVE BEEN REFERRED TO A SPECIALIST, IT MAY TAKE 1-2 WEEKS TO  SCHEDULE/PROCESS THE REFERRAL. IF YOU HAVE NOT HEARD FROM US/SPECIALIST IN TWO WEEKS, PLEASE GIVE [place] A CALL AT [phone] X [phone].   THE PATIENT IS ENCOURAGED TO PRACTICE SOCIAL DISTANCING DUE TO THE COVID-19 PANDEMIC.

## 2021-04-26 ENCOUNTER — Other Ambulatory Visit: Payer: Self-pay

## 2021-04-26 ENCOUNTER — Other Ambulatory Visit (HOSPITAL_COMMUNITY): Payer: Self-pay

## 2021-04-26 ENCOUNTER — Encounter: Payer: Self-pay | Admitting: Nurse Practitioner

## 2021-04-26 ENCOUNTER — Ambulatory Visit (INDEPENDENT_AMBULATORY_CARE_PROVIDER_SITE_OTHER): Payer: 59 | Admitting: Nurse Practitioner

## 2021-04-26 VITALS — BP 124/62 | HR 74 | Temp 98.6°F | Ht 63.6 in | Wt 159.0 lb

## 2021-04-26 DIAGNOSIS — Z0000 Encounter for general adult medical examination without abnormal findings: Secondary | ICD-10-CM | POA: Diagnosis not present

## 2021-04-26 DIAGNOSIS — R000 Tachycardia, unspecified: Secondary | ICD-10-CM

## 2021-04-26 DIAGNOSIS — E782 Mixed hyperlipidemia: Secondary | ICD-10-CM

## 2021-04-26 DIAGNOSIS — R21 Rash and other nonspecific skin eruption: Secondary | ICD-10-CM | POA: Diagnosis not present

## 2021-04-26 DIAGNOSIS — R7309 Other abnormal glucose: Secondary | ICD-10-CM

## 2021-04-26 DIAGNOSIS — G4701 Insomnia due to medical condition: Secondary | ICD-10-CM

## 2021-04-26 DIAGNOSIS — Z2821 Immunization not carried out because of patient refusal: Secondary | ICD-10-CM | POA: Diagnosis not present

## 2021-04-26 DIAGNOSIS — F5101 Primary insomnia: Secondary | ICD-10-CM

## 2021-04-26 DIAGNOSIS — Z79899 Other long term (current) drug therapy: Secondary | ICD-10-CM | POA: Diagnosis not present

## 2021-04-26 DIAGNOSIS — F419 Anxiety disorder, unspecified: Secondary | ICD-10-CM | POA: Diagnosis not present

## 2021-04-26 MED ORDER — METOPROLOL SUCCINATE ER 25 MG PO TB24
25.0000 mg | ORAL_TABLET | Freq: Every day | ORAL | 2 refills | Status: DC
  Filled 2021-04-26: qty 90, 90d supply, fill #0
  Filled 2021-06-22 – 2021-11-08 (×2): qty 90, 90d supply, fill #1
  Filled 2022-03-16: qty 90, 90d supply, fill #2

## 2021-04-26 MED ORDER — MAGNESIUM OXIDE 400 MG PO TABS
1.0000 | ORAL_TABLET | Freq: Every day | ORAL | 2 refills | Status: AC
  Filled 2021-04-26: qty 90, 90d supply, fill #0

## 2021-04-26 MED ORDER — NYSTATIN 100000 UNIT/GM EX POWD
1.0000 "application " | Freq: Three times a day (TID) | CUTANEOUS | 0 refills | Status: AC
  Filled 2021-04-26: qty 15, 5d supply, fill #0

## 2021-04-26 MED ORDER — TRAZODONE HCL 50 MG PO TABS
50.0000 mg | ORAL_TABLET | Freq: Every day | ORAL | 2 refills | Status: DC
  Filled 2021-04-26: qty 30, 30d supply, fill #0

## 2021-04-26 MED ORDER — SERTRALINE HCL 100 MG PO TABS
100.0000 mg | ORAL_TABLET | Freq: Every day | ORAL | 1 refills | Status: DC
  Filled 2021-04-26: qty 90, 90d supply, fill #0
  Filled 2021-06-22 – 2021-11-08 (×2): qty 90, 90d supply, fill #1

## 2021-04-26 NOTE — Progress Notes (Unsigned)
I,Tianna Badgett,acting as a Education administrator for [employer], FNP.,have documented all relevant documentation on the behalf of Minette Brine, FNP,as directed by  Minette Brine, FNP while in the presence of Minette Brine, [place].  This visit occurred during the SARS-CoV-2 public health emergency.  Safety protocols were in place, including screening questions prior to the visit, additional usage of staff PPE, and extensive cleaning of exam room while observing appropriate contact time as indicated for disinfecting solutions.  Subjective:     Patient ID: Cynthia Barron , female    DOB: 06-15-1965 , 56 y.o.   MRN: [id number]   Chief Complaint  Patient presents with   Annual Exam    HPI  Presents today for her annual HM exam.  Dr. Molli Posey is her GYN at [facility]    Past Medical History:  Diagnosis Date   Seasonal allergies    Tachycardia      Family History  Problem Relation Age of Onset   Parkinson's disease Mother    Diabetes Mother    Hypertension Mother    Hyperlipidemia Mother    Hypertension Father    Hyperlipidemia Father      Current Outpatient Medications:    estradiol (ESTRACE) 1 MG tablet, Take 1 tablet (1 mg total) by mouth daily., Disp: 90 tablet, Rfl: 3   fluticasone (FLONASE) 50 MCG/ACT nasal spray, Place 2 sprays into both nostrils daily., Disp: 16 g, Rfl: 2   hydrOXYzine (VISTARIL) 25 MG capsule, Take 1 capsule (25 mg total) by mouth 3 (three) times daily as needed., Disp: 30 capsule, Rfl: 5   levocetirizine (XYZAL) 5 MG tablet, Take 1 tablet (5 mg total) by mouth every evening., Disp: 90 tablet, Rfl: 1   meclizine (ANTIVERT) 25 MG tablet, Take 1 tablet (25 mg total) by mouth 3 (three) times daily as needed for dizziness., Disp: 30 tablet, Rfl: 0   NOREL AD 4-10-325 MG TABS, Take 1 tablet by mouth 2 (two) times daily as needed., Disp: 30 tablet, Rfl: 0   nystatin powder, Apply 1 application. topically 3 (three) times daily., Disp: 15 g, Rfl: 0    progesterone (PROMETRIUM) 100 MG capsule, Take 1 capsule (100 mg total) by mouth at bedtime., Disp: 90 capsule, Rfl: 3   SUMAtriptan (IMITREX) 50 MG tablet, Take 1 tablet (50 mg total)  by mouth once as needed for migraine. May repeat in 2 hours if headache persists or recurs., Disp: 10 tablet, Rfl: 5   magnesium oxide (MAG-OX) 400 MG tablet, Take 1 tablet (400 mg total) by mouth daily., Disp: 90 tablet, Rfl: 2   metoprolol succinate (TOPROL-XL) 25 MG 24 hr tablet, Take 1 tablet (25 mg total) by mouth daily., Disp: 90 tablet, Rfl: 2   sertraline (ZOLOFT) 100 MG tablet, Take 1 tablet (100 mg total) by mouth daily., Disp: 90 tablet, Rfl: 1   traZODone (DESYREL) 50 MG tablet, Take 1 tablet (50 mg total) by mouth at bedtime., Disp: 30 tablet, Rfl: 2   Allergies  Allergen Reactions   Dicyclomine Hcl     REACTION: Dizziness   Penicillins     REACTION: vomiting      The patient states she uses post menopausal status for birth control. Last LMP was No LMP recorded (lmp unknown). Patient is postmenopausal.. Negative for Dysmenorrhea and Negative for Menorrhagia. Negative for: breast discharge, breast lump(s), breast pain and breast self exam. Associated symptoms include abnormal vaginal bleeding. Pertinent negatives include abnormal bleeding (hematology), anxiety, decreased libido, depression, difficulty  falling sleep, dyspareunia, history of infertility, nocturia, sexual dysfunction, sleep disturbances, urinary incontinence, urinary urgency, vaginal discharge and vaginal itching. Diet regular with "lazy" keto - since January 2023. The patient states her exercise level is none.   The patient's tobacco use is:  Social History   Tobacco Use  Smoking Status Never  Smokeless Tobacco Never   She has been exposed to passive smoke. The patient's alcohol use is:  Social History   Substance and Sexual Activity  Alcohol Use Not Currently   Additional information: Last pap 01/05/2021, next one scheduled  for 01/06/2024.    Review of Systems  Constitutional: Negative.   HENT: Negative.    Eyes: Negative.   Respiratory: Negative.    Cardiovascular: Negative.   Gastrointestinal: Negative.   Endocrine: Negative.   Genitourinary: Negative.   Musculoskeletal: Negative.   Skin: Negative.   Allergic/Immunologic: Negative.   Neurological: Negative.   Hematological: Negative.   Psychiatric/Behavioral: Negative.      Today's Vitals   04/26/21 1414  BP: 124/62  Pulse: 74  Temp: 98.6 F (37 C)  TempSrc: Oral  Weight: 159 lb (72.1 kg)  Height: 5' 3.6" (1.615 m)   Body mass index is 27.64 kg/m.  Wt Readings from Last 3 Encounters:  04/26/21 159 lb (72.1 kg)  03/15/21 161 lb 3.2 oz (73.1 kg)  02/10/21 167 lb (75.8 kg)    Objective:  Physical Exam Vitals reviewed.  Constitutional:      General: She is not in acute distress.    Appearance: Normal appearance. She is normal weight.  HENT:     Head: Normocephalic and atraumatic.     Right Ear: Tympanic membrane, ear canal and external ear normal. There is no impacted cerumen.     Left Ear: Tympanic membrane, ear canal and external ear normal. There is no impacted cerumen.     Nose:     Comments: Deferred wearing mask    Mouth/Throat:     Comments: Deferred wearing mask Eyes:     Extraocular Movements: Extraocular movements intact.     Conjunctiva/sclera: Conjunctivae normal.     Pupils: Pupils are equal, round, and reactive to light.  Neck:     Vascular: No carotid bruit.  Cardiovascular:     Rate and Rhythm: Normal rate.     Pulses: Normal pulses.     Heart sounds: Normal heart sounds. No murmur heard. Pulmonary:     Effort: Pulmonary effort is normal. No respiratory distress.     Breath sounds: Normal breath sounds. No wheezing.  Abdominal:     General: Abdomen is flat. Bowel sounds are normal. There is no distension.     Palpations: Abdomen is soft.  Musculoskeletal:        General: Normal range of motion.      Cervical back: Normal range of motion. No tenderness.  Lymphadenopathy:     Cervical: No cervical adenopathy.  Skin:    General: Skin is warm and dry.     Capillary Refill: Capillary refill takes less than 2 seconds.     Findings: Rash (erythema underneath bilateral breast) present.  Neurological:     General: No focal deficit present.     Mental Status: She is alert and oriented to person, place, and time.     Cranial Nerves: No cranial nerve deficit.  Psychiatric:        Mood and Affect: Mood normal.        Behavior: Behavior normal.  Thought Content: Thought content normal.        Judgment: Judgment normal.        Assessment And Plan:     1. Routine general medical examination at health care facility  2. Mixed hyperlipidemia - CMP14+EGFR - Lipid panel  3. Abnormal glucose - Hemoglobin A1c - CMP14+EGFR  4. Tachycardia - metoprolol succinate (TOPROL-XL) 25 MG 24 hr tablet; Take 1 tablet (25 mg total) by mouth daily.  Dispense: 90 tablet; Refill: 2 - magnesium oxide (MAG-OX) 400 MG tablet; Take 1 tablet (400 mg total) by mouth daily.  Dispense: 90 tablet; Refill: 2  5. Anxiety - sertraline (ZOLOFT) 100 MG tablet; Take 1 tablet (100 mg total) by mouth daily.  Dispense: 90 tablet; Refill: 1  6. Insomnia due to medical condition - traZODone (DESYREL) 50 MG tablet; Take 1 tablet (50 mg total) by mouth at bedtime.  Dispense: 30 tablet; Refill: 2  7. Other long term (current) drug therapy - CBC  8. Rash and nonspecific skin eruption Comments: Has erythema underneath breast bilateral  - nystatin powder; Apply 1 application. topically 3 (three) times daily.  Dispense: 15 g; Refill: 0     Patient was given opportunity to ask questions. Patient verbalized understanding of the plan and was able to repeat key elements of the plan. All questions were answered to their satisfaction.   Minette Brine, FNP   I, Minette Brine, FNP, have reviewed all documentation for this  visit. The documentation on 04/26/21 for the exam, diagnosis, procedures, and orders are all accurate and complete.  THE PATIENT IS ENCOURAGED TO PRACTICE SOCIAL DISTANCING DUE TO THE COVID-19 PANDEMIC.

## 2021-04-26 NOTE — Patient Instructions (Signed)

## 2021-04-27 LAB — LIPID PANEL
Chol/HDL Ratio: 4.3 ratio (ref 0.0–4.4)
Cholesterol, Total: 221 mg/dL — ABNORMAL HIGH (ref 100–199)
HDL: 51 mg/dL (ref >39)
LDL Chol Calc (NIH): 146 mg/dL — ABNORMAL HIGH (ref 0–99)
Triglycerides: 136 mg/dL (ref 0–149)
VLDL Cholesterol Cal: 24 mg/dL (ref 5–40)

## 2021-04-27 LAB — CMP14+EGFR
ALT: 17 IU/L (ref 0–32)
AST: 21 IU/L (ref 0–40)
Albumin/Globulin Ratio: 1.6 (ref 1.2–2.2)
Albumin: 4.6 g/dL (ref 3.8–4.9)
Alkaline Phosphatase: 106 IU/L (ref 44–121)
BUN/Creatinine Ratio: 24 — ABNORMAL HIGH (ref 9–23)
BUN: 22 mg/dL (ref 6–24)
Bilirubin Total: <0.2 mg/dL (ref 0.0–1.2)
CO2: 19 mmol/L — ABNORMAL LOW (ref 20–29)
Calcium: 9.7 mg/dL (ref 8.7–10.2)
Chloride: 102 mmol/L (ref 96–106)
Creatinine, Ser: 0.9 mg/dL (ref 0.57–1.00)
Globulin, Total: 2.9 g/dL (ref 1.5–4.5)
Glucose: 80 mg/dL (ref 70–99)
Potassium: 4.6 mmol/L (ref 3.5–5.2)
Sodium: 139 mmol/L (ref 134–144)
Total Protein: 7.5 g/dL (ref 6.0–8.5)
eGFR: 75 mL/min/{1.73_m2} (ref >59)

## 2021-04-27 LAB — CBC
Hematocrit: 41.7 % (ref 34.0–46.6)
Hemoglobin: 13.4 g/dL (ref 11.1–15.9)
MCH: 29 pg (ref 26.6–33.0)
MCHC: 32.1 g/dL (ref 31.5–35.7)
MCV: 90 fL (ref 79–97)
Platelets: 310 10*3/uL (ref 150–450)
RBC: 4.62 x10E6/uL (ref 3.77–5.28)
RDW: 12.3 % (ref 11.7–15.4)
WBC: 5.6 10*3/uL (ref 3.4–10.8)

## 2021-04-27 LAB — HEMOGLOBIN A1C
Est. average glucose Bld gHb Est-mCnc: 123 mg/dL
Hgb A1c MFr Bld: 5.9 % — ABNORMAL HIGH (ref 4.8–5.6)

## 2021-06-22 ENCOUNTER — Other Ambulatory Visit (HOSPITAL_COMMUNITY): Payer: Self-pay

## 2021-06-24 ENCOUNTER — Other Ambulatory Visit (HOSPITAL_COMMUNITY): Payer: Self-pay

## 2021-07-19 ENCOUNTER — Other Ambulatory Visit (HOSPITAL_COMMUNITY): Payer: Self-pay

## 2021-09-02 ENCOUNTER — Other Ambulatory Visit: Payer: Self-pay | Admitting: Nurse Practitioner

## 2021-09-02 ENCOUNTER — Other Ambulatory Visit (HOSPITAL_COMMUNITY): Payer: Self-pay

## 2021-09-02 DIAGNOSIS — F419 Anxiety disorder, unspecified: Secondary | ICD-10-CM

## 2021-09-02 MED ORDER — HYDROXYZINE PAMOATE 25 MG PO CAPS
25.0000 mg | ORAL_CAPSULE | Freq: Three times a day (TID) | ORAL | 5 refills | Status: DC | PRN
  Filled 2021-09-02: qty 30, 10d supply, fill #0

## 2021-11-05 ENCOUNTER — Encounter: Payer: Self-pay | Admitting: Nurse Practitioner

## 2021-11-08 ENCOUNTER — Other Ambulatory Visit (HOSPITAL_COMMUNITY): Payer: Self-pay

## 2021-11-10 ENCOUNTER — Ambulatory Visit (INDEPENDENT_AMBULATORY_CARE_PROVIDER_SITE_OTHER): Payer: 59

## 2021-11-10 ENCOUNTER — Other Ambulatory Visit: Payer: Self-pay | Admitting: Podiatry

## 2021-11-10 ENCOUNTER — Ambulatory Visit: Payer: 59 | Admitting: Podiatry

## 2021-11-10 ENCOUNTER — Encounter: Payer: Self-pay | Admitting: Podiatry

## 2021-11-10 DIAGNOSIS — M25872 Other specified joint disorders, left ankle and foot: Secondary | ICD-10-CM

## 2021-11-10 NOTE — Progress Notes (Signed)
Subjective:  Patient ID: Cynthia Barron, female    DOB: 05-Aug-1965,  MRN: [id number]  Chief Complaint  Patient presents with   Bunions    Left foot bunion causing some discomfort     56 y.o. female presents with the above complaint.  Patient presents with complaint of left sesamoiditis.  Patient states pain for touch is progressive gotten worse worse with ambulation hurts with pressure.  She states that it is causing her some discomfort she wanted to get it evaluated she does a lot on her foot.  She has not done anything to help with the pain.  Pain scale 7 out of 10.  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   Review of Systems: Negative except as noted in the HPI. Denies N/V/F/Ch.  Past Medical History:  Diagnosis Date   Seasonal allergies    Tachycardia     Current Outpatient Medications:    estradiol (ESTRACE) 1 MG tablet, Take 1 tablet (1 mg total) by mouth daily., Disp: 90 tablet, Rfl: 3   fluticasone (FLONASE) 50 MCG/ACT nasal spray, Place 2 sprays into both nostrils daily., Disp: 16 g, Rfl: 2   hydrOXYzine (VISTARIL) 25 MG capsule, Take 1 capsule (25 mg total) by mouth 3 (three) times daily as needed., Disp: 30 capsule, Rfl: 5   levocetirizine (XYZAL) 5 MG tablet, Take 1 tablet (5 mg total) by mouth every evening., Disp: 90 tablet, Rfl: 1   magnesium oxide (MAG-OX) 400 MG tablet, Take 1 tablet (400 mg total) by mouth daily., Disp: 90 tablet, Rfl: 2   meclizine (ANTIVERT) 25 MG tablet, Take 1 tablet (25 mg total) by mouth 3 (three) times daily as needed for dizziness., Disp: 30 tablet, Rfl: 0   metoprolol succinate (TOPROL-XL) 25 MG 24 hr tablet, Take 1 tablet (25 mg total) by mouth daily., Disp: 90 tablet, Rfl: 2   NOREL AD 4-10-325 MG TABS, Take 1 tablet by mouth 2 (two) times daily as needed., Disp: 30 tablet, Rfl: 0   nystatin powder, Apply 1 application topically 3 (three) times daily., Disp: 15 g, Rfl: 0   progesterone (PROMETRIUM) 100 MG capsule, Take 1 capsule (100  mg total) by mouth at bedtime., Disp: 90 capsule, Rfl: 3   sertraline (ZOLOFT) 100 MG tablet, Take 1 tablet (100 mg total) by mouth daily., Disp: 90 tablet, Rfl: 1   SUMAtriptan (IMITREX) 50 MG tablet, Take 1 tablet (50 mg total)  by mouth once as needed for migraine. May repeat in 2 hours if headache persists or recurs., Disp: 10 tablet, Rfl: 5   traZODone (DESYREL) 50 MG tablet, Take 1 tablet (50 mg total) by mouth at bedtime., Disp: 30 tablet, Rfl: 2  Social History   Tobacco Use  Smoking Status Never  Smokeless Tobacco Never    Allergies  Allergen Reactions   Dicyclomine Hcl     REACTION: Dizziness   Penicillins     REACTION: vomiting   Objective:  There were no vitals filed for this visit. There is no height or weight on file to calculate BMI. Constitutional Well developed. Well nourished.  Vascular Dorsalis pedis pulses palpable bilaterally. Posterior tibial pulses palpable bilaterally. Capillary refill normal to all digits.  No cyanosis or clubbing noted. Pedal hair growth normal.  Neurologic Normal speech. Oriented to person, place, and time. Epicritic sensation to light touch grossly present bilaterally.  Dermatologic Nails well groomed and normal in appearance. No open wounds. No skin lesions.  Orthopedic: Pain on palpation to the left  sesamoidal complex.  Pain right at the tibial sesamoid.  No pain with dorsiflexion plantarflexion of the metatarsophalangeal joint no intra-articular pain noted.   Radiographs: 3 views of skeletally mature adult left foot: No fracture noted.  No signs of bipartite sesamoid noted.  Bunion deformity noted moderate in nature. Assessment:   1. Left foot pain    Plan:  Patient was evaluated and treated and all questions answered.  Left sesamoiditis -All question and concerns were discussed with the patient in extensive detail.  Given the amount of pain that she is experiencing she will benefit from cam boot immobilization I discussed  with the patient she states understanding if there is no improvement we will discuss steroid injection under next clinical visit.  She states understanding. -.  Cam boot was dispensed  No follow-ups on file.   Sesamoiditis left foot cam boot immobilization injection next time

## 2021-12-08 ENCOUNTER — Ambulatory Visit: Payer: 59 | Admitting: Podiatry

## 2021-12-08 DIAGNOSIS — Q666 Other congenital valgus deformities of feet: Secondary | ICD-10-CM | POA: Diagnosis not present

## 2021-12-08 DIAGNOSIS — M25872 Other specified joint disorders, left ankle and foot: Secondary | ICD-10-CM

## 2021-12-08 NOTE — Progress Notes (Signed)
Subjective:  Patient ID: Cynthia Barron, female    DOB: Apr 16, 1965,  MRN: [id number]  Chief Complaint  Patient presents with   Foot Pain    Left foot pain Pt stated that her foot pain is a little better     56 y.o. female presents with the above complaint.  Patient presents with follow-up of left sesamoiditis.  She states she is doing a lot better.  She would like to discuss next treatment plan.  She does not any pain she is been wearing the boot.   Review of Systems: Negative except as noted in the HPI. Denies N/V/F/Ch.  Past Medical History:  Diagnosis Date   Seasonal allergies    Tachycardia     Current Outpatient Medications:    estradiol (ESTRACE) 1 MG tablet, Take 1 tablet (1 mg total) by mouth daily., Disp: 90 tablet, Rfl: 3   fluticasone (FLONASE) 50 MCG/ACT nasal spray, Place 2 sprays into both nostrils daily., Disp: 16 g, Rfl: 2   hydrOXYzine (VISTARIL) 25 MG capsule, Take 1 capsule (25 mg total) by mouth 3 (three) times daily as needed., Disp: 30 capsule, Rfl: 5   levocetirizine (XYZAL) 5 MG tablet, Take 1 tablet (5 mg total) by mouth every evening., Disp: 90 tablet, Rfl: 1   magnesium oxide (MAG-OX) 400 MG tablet, Take 1 tablet (400 mg total) by mouth daily., Disp: 90 tablet, Rfl: 2   meclizine (ANTIVERT) 25 MG tablet, Take 1 tablet (25 mg total) by mouth 3 (three) times daily as needed for dizziness., Disp: 30 tablet, Rfl: 0   metoprolol succinate (TOPROL-XL) 25 MG 24 hr tablet, Take 1 tablet (25 mg total) by mouth daily., Disp: 90 tablet, Rfl: 2   NOREL AD 4-10-325 MG TABS, Take 1 tablet by mouth 2 (two) times daily as needed., Disp: 30 tablet, Rfl: 0   nystatin powder, Apply 1 application topically 3 (three) times daily., Disp: 15 g, Rfl: 0   progesterone (PROMETRIUM) 100 MG capsule, Take 1 capsule (100 mg total) by mouth at bedtime., Disp: 90 capsule, Rfl: 3   sertraline (ZOLOFT) 100 MG tablet, Take 1 tablet (100 mg total) by mouth daily., Disp: 90 tablet, Rfl:  1   SUMAtriptan (IMITREX) 50 MG tablet, Take 1 tablet (50 mg total)  by mouth once as needed for migraine. May repeat in 2 hours if headache persists or recurs., Disp: 10 tablet, Rfl: 5   traZODone (DESYREL) 50 MG tablet, Take 1 tablet (50 mg total) by mouth at bedtime., Disp: 30 tablet, Rfl: 2  Social History   Tobacco Use  Smoking Status Never  Smokeless Tobacco Never    Allergies  Allergen Reactions   Dicyclomine Hcl     REACTION: Dizziness   Penicillins     REACTION: vomiting   Objective:  There were no vitals filed for this visit. There is no height or weight on file to calculate BMI. Constitutional Well developed. Well nourished.  Vascular Dorsalis pedis pulses palpable bilaterally. Posterior tibial pulses palpable bilaterally. Capillary refill normal to all digits.  No cyanosis or clubbing noted. Pedal hair growth normal.  Neurologic Normal speech. Oriented to person, place, and time. Epicritic sensation to light touch grossly present bilaterally.  Dermatologic Nails well groomed and normal in appearance. No open wounds. No skin lesions.  Orthopedic: No further pain on palpation to the left sesamoidal complex.  No further pain right at the tibial sesamoid.  No pain with dorsiflexion plantarflexion of the metatarsophalangeal joint no intra-articular pain noted.  Radiographs: 3 views of skeletally mature adult left foot: No fracture noted.  No signs of bipartite sesamoid noted.  Bunion deformity noted moderate in nature. Assessment:   1. Pes planovalgus   2. Sesamoiditis of left foot     Plan:  Patient was evaluated and treated and all questions answered.  Left sesamoiditis -Clinically healed with cam boot immobilization at this time I discussed shoe gear modification and orthotics management.  Patient agrees with the plan like to proceed with orthotics.  -Pes planovalgus -I explained to patient the etiology of pes planovalgus and relationship with Planter  fasciitis and various treatment options were discussed.  Given patient foot structure in the setting of Planter fasciitis I believe patient will benefit from custom-made orthotics to help control the hindfoot motion support the arch of the foot and take the stress away from plantar fascial.  Patient agrees with the plan like to proceed with orthotics -Patient was casted for orthotics  No follow-ups on file.   Sesamoiditis left foot cam boot immobilization injection next time

## 2022-01-04 ENCOUNTER — Other Ambulatory Visit: Payer: 59

## 2022-01-04 ENCOUNTER — Telehealth: Payer: Self-pay | Admitting: Podiatry

## 2022-01-04 NOTE — Telephone Encounter (Signed)
Lmom to call back to reschedule picking up her orthotics

## 2022-01-10 ENCOUNTER — Ambulatory Visit: Payer: 59 | Admitting: *Deleted

## 2022-01-10 DIAGNOSIS — Q666 Other congenital valgus deformities of feet: Secondary | ICD-10-CM

## 2022-01-14 NOTE — Progress Notes (Signed)
Patient presents today to pick up custom molded foot orthotics recommended by Dr. Posey Pronto.   Orthotics were dispensed and fit was satisfactory. Reviewed instructions for break-in and wear. Written instructions given to patient.  Patient will follow up as needed.   [facility] - order # [id number]

## 2022-02-23 ENCOUNTER — Other Ambulatory Visit (HOSPITAL_COMMUNITY): Payer: Self-pay

## 2022-02-23 DIAGNOSIS — Z6827 Body mass index (BMI) 27.0-27.9, adult: Secondary | ICD-10-CM | POA: Diagnosis not present

## 2022-02-23 DIAGNOSIS — Z1231 Encounter for screening mammogram for malignant neoplasm of breast: Secondary | ICD-10-CM | POA: Diagnosis not present

## 2022-02-23 DIAGNOSIS — Z01419 Encounter for gynecological examination (general) (routine) without abnormal findings: Secondary | ICD-10-CM | POA: Diagnosis not present

## 2022-02-23 DIAGNOSIS — Z79890 Hormone replacement therapy (postmenopausal): Secondary | ICD-10-CM | POA: Diagnosis not present

## 2022-02-23 DIAGNOSIS — Z1151 Encounter for screening for human papillomavirus (HPV): Secondary | ICD-10-CM | POA: Diagnosis not present

## 2022-02-23 DIAGNOSIS — Z124 Encounter for screening for malignant neoplasm of cervix: Secondary | ICD-10-CM | POA: Diagnosis not present

## 2022-02-23 LAB — HM PAP SMEAR: HM Pap smear: NORMAL

## 2022-02-23 MED ORDER — ESTRADIOL 1 MG PO TABS
1.0000 mg | ORAL_TABLET | Freq: Every day | ORAL | 3 refills | Status: DC
  Filled 2022-02-23: qty 90, 90d supply, fill #0

## 2022-02-23 MED ORDER — PROGESTERONE MICRONIZED 100 MG PO CAPS
100.0000 mg | ORAL_CAPSULE | Freq: Every day | ORAL | 3 refills | Status: DC
  Filled 2022-02-23: qty 90, 90d supply, fill #0

## 2022-03-16 ENCOUNTER — Other Ambulatory Visit: Payer: Self-pay | Admitting: Nurse Practitioner

## 2022-03-16 DIAGNOSIS — F419 Anxiety disorder, unspecified: Secondary | ICD-10-CM

## 2022-03-16 DIAGNOSIS — R519 Headache, unspecified: Secondary | ICD-10-CM

## 2022-03-17 ENCOUNTER — Other Ambulatory Visit: Payer: Self-pay

## 2022-03-17 ENCOUNTER — Other Ambulatory Visit (HOSPITAL_COMMUNITY): Payer: Self-pay

## 2022-03-17 MED ORDER — SUMATRIPTAN SUCCINATE 50 MG PO TABS
ORAL_TABLET | ORAL | 5 refills | Status: DC
  Filled 2022-03-17: qty 10, 30d supply, fill #0
  Filled 2022-06-30: qty 10, 30d supply, fill #1
  Filled 2022-11-23: qty 10, 30d supply, fill #2

## 2022-03-24 ENCOUNTER — Encounter: Payer: Self-pay | Admitting: Nurse Practitioner

## 2022-03-24 ENCOUNTER — Other Ambulatory Visit (HOSPITAL_COMMUNITY): Payer: Self-pay

## 2022-03-24 DIAGNOSIS — F419 Anxiety disorder, unspecified: Secondary | ICD-10-CM

## 2022-03-24 DIAGNOSIS — R42 Dizziness and giddiness: Secondary | ICD-10-CM

## 2022-03-24 MED ORDER — SERTRALINE HCL 100 MG PO TABS
100.0000 mg | ORAL_TABLET | Freq: Every day | ORAL | 0 refills | Status: DC
  Filled 2022-03-24: qty 90, 90d supply, fill #0

## 2022-03-24 MED ORDER — MECLIZINE HCL 25 MG PO TABS
25.0000 mg | ORAL_TABLET | Freq: Three times a day (TID) | ORAL | 0 refills | Status: DC | PRN
  Filled 2022-03-24: qty 30, 10d supply, fill #0

## 2022-03-28 ENCOUNTER — Other Ambulatory Visit (HOSPITAL_COMMUNITY): Payer: Self-pay

## 2022-03-28 MED ORDER — MECLIZINE HCL 25 MG PO TABS
25.0000 mg | ORAL_TABLET | Freq: Three times a day (TID) | ORAL | 1 refills | Status: AC
  Filled 2022-03-28 – 2022-06-30 (×2): qty 60, 20d supply, fill #0

## 2022-04-14 ENCOUNTER — Ambulatory Visit: Payer: Commercial Managed Care - PPO | Admitting: Podiatry

## 2022-04-27 ENCOUNTER — Encounter: Payer: 59 | Admitting: Nurse Practitioner

## 2022-05-02 ENCOUNTER — Ambulatory Visit (INDEPENDENT_AMBULATORY_CARE_PROVIDER_SITE_OTHER): Payer: Commercial Managed Care - PPO | Admitting: Nurse Practitioner

## 2022-05-02 ENCOUNTER — Other Ambulatory Visit (HOSPITAL_COMMUNITY): Payer: Self-pay

## 2022-05-02 ENCOUNTER — Encounter: Payer: Self-pay | Admitting: Nurse Practitioner

## 2022-05-02 VITALS — BP 116/62 | HR 78 | Temp 98.3°F | Ht 63.5 in | Wt 158.0 lb

## 2022-05-02 DIAGNOSIS — F419 Anxiety disorder, unspecified: Secondary | ICD-10-CM

## 2022-05-02 DIAGNOSIS — Z23 Encounter for immunization: Secondary | ICD-10-CM

## 2022-05-02 DIAGNOSIS — Z8669 Personal history of other diseases of the nervous system and sense organs: Secondary | ICD-10-CM | POA: Diagnosis not present

## 2022-05-02 DIAGNOSIS — Z6827 Body mass index (BMI) 27.0-27.9, adult: Secondary | ICD-10-CM

## 2022-05-02 DIAGNOSIS — Z1321 Encounter for screening for nutritional disorder: Secondary | ICD-10-CM | POA: Diagnosis not present

## 2022-05-02 DIAGNOSIS — Z0000 Encounter for general adult medical examination without abnormal findings: Secondary | ICD-10-CM | POA: Diagnosis not present

## 2022-05-02 DIAGNOSIS — E782 Mixed hyperlipidemia: Secondary | ICD-10-CM

## 2022-05-02 DIAGNOSIS — Z2821 Immunization not carried out because of patient refusal: Secondary | ICD-10-CM | POA: Diagnosis not present

## 2022-05-02 DIAGNOSIS — F5101 Primary insomnia: Secondary | ICD-10-CM | POA: Diagnosis not present

## 2022-05-02 DIAGNOSIS — R7309 Other abnormal glucose: Secondary | ICD-10-CM

## 2022-05-02 MED ORDER — TRAZODONE HCL 50 MG PO TABS
50.0000 mg | ORAL_TABLET | Freq: Every day | ORAL | 2 refills | Status: DC
  Filled 2022-05-02 – 2022-06-30 (×2): qty 30, 30d supply, fill #0
  Filled 2023-03-29: qty 30, 30d supply, fill #1

## 2022-05-02 MED ORDER — HYDROXYZINE PAMOATE 25 MG PO CAPS
25.0000 mg | ORAL_CAPSULE | Freq: Three times a day (TID) | ORAL | 5 refills | Status: DC | PRN
  Filled 2022-05-02 – 2023-02-21 (×2): qty 30, 10d supply, fill #0

## 2022-05-02 NOTE — Progress Notes (Signed)
I,Sheena H Holbrook,acting as a Education administrator for Minette Brine, FNP.,have documented all relevant documentation on the behalf of Minette Brine, FNP,as directed by  Minette Brine, FNP while in the presence of Minette Brine, [place].   Subjective:     Patient ID: Cynthia Barron , female    DOB: 1965/04/15 , 57 y.o.   MRN: [id number]   Chief Complaint  Patient presents with   Annual Exam    HPI  Patient presents today for annual exam.  Patient has completed mammo and pap.  She has seen the podiatrist - she has an inflammed bone to her left plantar surface of foot  Wt Readings from Last 3 Encounters: 05/02/22 : 209 lb (94.8 kg) 04/26/21 : 159 lb (72.1 kg) 03/15/21 : 161 lb 3.2 oz (73.1 kg)  Has had to use imitrex 2-3 times in the last few months.      Past Medical History:  Diagnosis Date   Seasonal allergies    Tachycardia      Family History  Problem Relation Age of Onset   Parkinson's disease Mother    Diabetes Mother    Hypertension Mother    Hyperlipidemia Mother    Hypertension Father    Hyperlipidemia Father      Current Outpatient Medications:    estradiol (ESTRACE) 1 MG tablet, Take 1 tablet (1 mg total) by mouth daily., Disp: 90 tablet, Rfl: 3   fluticasone (FLONASE) 50 MCG/ACT nasal spray, Place 2 sprays into both nostrils daily., Disp: 16 g, Rfl: 2   levocetirizine (XYZAL) 5 MG tablet, Take 1 tablet (5 mg total) by mouth every evening., Disp: 90 tablet, Rfl: 1   magnesium oxide (MAG-OX) 400 MG tablet, Take 1 tablet (400 mg total) by mouth daily., Disp: 90 tablet, Rfl: 2   meclizine (ANTIVERT) 25 MG tablet, Take 1 tablet by mouth 3 times a day, Disp: 60 tablet, Rfl: 1   metoprolol succinate (TOPROL-XL) 25 MG 24 hr tablet, Take 1 tablet (25 mg total) by mouth daily., Disp: 90 tablet, Rfl: 2   NOREL AD 4-10-325 MG TABS, Take 1 tablet by mouth 2 (two) times daily as needed., Disp: 30 tablet, Rfl: 0   nystatin powder, Apply 1 application topically 3 (three) times daily.,  Disp: 15 g, Rfl: 0   progesterone (PROMETRIUM) 100 MG capsule, Take 1 capsule (100 mg total) by mouth at bedtime., Disp: 90 capsule, Rfl: 3   sertraline (ZOLOFT) 100 MG tablet, Take 1 tablet (100 mg total) by mouth daily., Disp: 90 tablet, Rfl: 0   SUMAtriptan (IMITREX) 50 MG tablet, Take 1 tablet (50 mg total)  by mouth once as needed for migraine. May repeat in 2 hours if headache persists or recurs., Disp: 10 tablet, Rfl: 5   hydrOXYzine (VISTARIL) 25 MG capsule, Take 1 capsule (25 mg total) by mouth 3 (three) times daily as needed., Disp: 30 capsule, Rfl: 5   traZODone (DESYREL) 50 MG tablet, Take 1 tablet (50 mg total) by mouth at bedtime., Disp: 30 tablet, Rfl: 2   Allergies  Allergen Reactions   Dicyclomine Hcl     REACTION: Dizziness   Penicillins     REACTION: vomiting      The patient states she is post menopausal status.  No LMP recorded (lmp unknown). Patient is postmenopausal.. Negative for Dysmenorrhea and Negative for Menorrhagia. Negative for: breast discharge, breast lump(s), breast pain and breast self exam. Associated symptoms include abnormal vaginal bleeding. Pertinent negatives include abnormal bleeding (hematology), anxiety, decreased libido,  depression, difficulty falling sleep, dyspareunia, history of infertility, nocturia, sexual dysfunction, sleep disturbances, urinary incontinence, urinary urgency, vaginal discharge and vaginal itching. Diet regular; she has fallen off of her diet. The patient states her exercise level is minimal - 1-2 times a week. She would like a one month supply of phentermine to "jump start " her weight loss.   The patient's tobacco use is:  Social History   Tobacco Use  Smoking Status Never  Smokeless Tobacco Never  . She has been exposed to passive smoke. The patient's alcohol use is:  Social History   Substance and Sexual Activity  Alcohol Use Not Currently   Additional information: Last pap 02/23/2022, next one scheduled for  02/23/2025.    Review of Systems  Constitutional: Negative.   HENT: Negative.    Eyes: Negative.   Respiratory: Negative.    Cardiovascular: Negative.   Gastrointestinal: Negative.   Endocrine: Negative.   Genitourinary: Negative.   Musculoskeletal: Negative.   Skin: Negative.   Allergic/Immunologic: Negative.   Neurological: Negative.   Hematological: Negative.   Psychiatric/Behavioral: Negative.       Today's Vitals   05/02/22 0848  BP: 116/62  Pulse: 78  Temp: 98.3 F (36.8 C)  TempSrc: Oral  SpO2: 96%  Weight: 158 lb (71.7 kg)  Height: 5' 3.5" (1.613 m)   Body mass index is 27.55 kg/m.   Objective:  Physical Exam Vitals reviewed.  Constitutional:      General: She is not in acute distress.    Appearance: Normal appearance. She is normal weight.  HENT:     Head: Normocephalic and atraumatic.     Right Ear: Tympanic membrane, ear canal and external ear normal. There is no impacted cerumen.     Left Ear: Tympanic membrane, ear canal and external ear normal. There is no impacted cerumen.     Nose: Nose normal.     Mouth/Throat:     Mouth: Mucous membranes are moist.  Eyes:     Extraocular Movements: Extraocular movements intact.     Conjunctiva/sclera: Conjunctivae normal.     Pupils: Pupils are equal, round, and reactive to light.  Neck:     Vascular: No carotid bruit.  Cardiovascular:     Rate and Rhythm: Normal rate.     Pulses: Normal pulses.     Heart sounds: Normal heart sounds. No murmur heard. Pulmonary:     Effort: Pulmonary effort is normal. No respiratory distress.     Breath sounds: Normal breath sounds. No wheezing.  Abdominal:     General: Abdomen is flat. Bowel sounds are normal. There is no distension.     Palpations: Abdomen is soft.  Genitourinary:    Comments: Deferred - done at GYN Musculoskeletal:        General: Normal range of motion.     Cervical back: Normal range of motion and neck supple. No tenderness.  Lymphadenopathy:      Cervical: No cervical adenopathy.  Skin:    General: Skin is warm and dry.     Capillary Refill: Capillary refill takes less than 2 seconds.  Neurological:     General: No focal deficit present.     Mental Status: She is alert and oriented to person, place, and time.     Cranial Nerves: No cranial nerve deficit.     Motor: No weakness.  Psychiatric:        Mood and Affect: Mood normal.        Behavior: Behavior normal.  Thought Content: Thought content normal.        Judgment: Judgment normal.         Assessment And Plan:     1. Routine general medical examination at health care facility Behavior modifications discussed and diet history reviewed.   Pt will continue to exercise regularly and modify diet with low GI, plant based foods and decrease intake of processed foods.  Recommend intake of daily multivitamin, Vitamin D, and calcium.  Recommend mammogram and colonoscopy for preventive screenings, as well as recommend immunizations that include influenza, TDAP, and Shingles (declines)  - CBC - CMP14+EGFR  2. Encounter for vitamin deficiency screening - VITAMIN D 25 Hydroxy (Vit-D Deficiency, Fractures)  3. BMI 27.0-27.9,adult Comments: Will provide one time Rx for phentermine, weight is stable.  4. Mixed hyperlipidemia Comments: Cholesterol levels are stable. Continue focusing on low fat diet - Lipid panel  5. Abnormal glucose Comments: Stable, will check HgbA1c - Hemoglobin A1c  6. Primary insomnia Comments: Continue with magnesium and trazodone, has used trazodone limited amount of times. - traZODone (DESYREL) 50 MG tablet; Take 1 tablet (50 mg total) by mouth at bedtime.  Dispense: 30 tablet; Refill: 2  7. Anxiety Comments: Stable, continue current medications - hydrOXYzine (VISTARIL) 25 MG capsule; Take 1 capsule (25 mg total) by mouth 3 (three) times daily as needed.  Dispense: 30 capsule; Refill: 5  8. History of migraine Comments: Having 1-2 migraines  every few months. Continue current medications  9. Need for Tdap vaccination Will give tetanus vaccine today while in office. Refer to order management. TDAP will be administered to adults 55-1 years old every 10 years. - Tdap vaccine greater than or equal to 7yo IM  10. Herpes zoster vaccination declined Declines shingrix, educated on disease process and is aware if he changes his mind to notify office    Patient was given opportunity to ask questions. Patient verbalized understanding of the plan and was able to repeat key elements of the plan. All questions were answered to their satisfaction.   Minette Brine, FNP   I, Minette Brine, FNP, have reviewed all documentation for this visit. The documentation on 05/02/22 for the exam, diagnosis, procedures, and orders are all accurate and complete.   THE PATIENT IS ENCOURAGED TO PRACTICE SOCIAL DISTANCING DUE TO THE COVID-19 PANDEMIC.

## 2022-05-03 ENCOUNTER — Other Ambulatory Visit: Payer: Self-pay | Admitting: Nurse Practitioner

## 2022-05-03 DIAGNOSIS — Z6827 Body mass index (BMI) 27.0-27.9, adult: Secondary | ICD-10-CM

## 2022-05-03 LAB — HEMOGLOBIN A1C
Est. average glucose Bld gHb Est-mCnc: 120 mg/dL
Hgb A1c MFr Bld: 5.8 % — ABNORMAL HIGH (ref 4.8–5.6)

## 2022-05-03 LAB — CBC
Hematocrit: 41.6 % (ref 34.0–46.6)
Hemoglobin: 13.5 g/dL (ref 11.1–15.9)
MCH: 29.5 pg (ref 26.6–33.0)
MCHC: 32.5 g/dL (ref 31.5–35.7)
MCV: 91 fL (ref 79–97)
Platelets: 290 10*3/uL (ref 150–450)
RBC: 4.57 x10E6/uL (ref 3.77–5.28)
RDW: 12.3 % (ref 11.7–15.4)
WBC: 4.4 10*3/uL (ref 3.4–10.8)

## 2022-05-03 LAB — CMP14+EGFR
ALT: 22 IU/L (ref 0–32)
AST: 22 IU/L (ref 0–40)
Albumin/Globulin Ratio: 1.9 (ref 1.2–2.2)
Albumin: 4.7 g/dL (ref 3.8–4.9)
Alkaline Phosphatase: 81 IU/L (ref 44–121)
BUN/Creatinine Ratio: 20 (ref 9–23)
BUN: 15 mg/dL (ref 6–24)
Bilirubin Total: 0.3 mg/dL (ref 0.0–1.2)
CO2: 21 mmol/L (ref 20–29)
Calcium: 9.4 mg/dL (ref 8.7–10.2)
Chloride: 106 mmol/L (ref 96–106)
Creatinine, Ser: 0.74 mg/dL (ref 0.57–1.00)
Globulin, Total: 2.5 g/dL (ref 1.5–4.5)
Glucose: 98 mg/dL (ref 70–99)
Potassium: 4.9 mmol/L (ref 3.5–5.2)
Sodium: 141 mmol/L (ref 134–144)
Total Protein: 7.2 g/dL (ref 6.0–8.5)
eGFR: 94 mL/min/{1.73_m2} (ref >59)

## 2022-05-03 LAB — LIPID PANEL
Chol/HDL Ratio: 4.7 ratio — ABNORMAL HIGH (ref 0.0–4.4)
Cholesterol, Total: 236 mg/dL — ABNORMAL HIGH (ref 100–199)
HDL: 50 mg/dL (ref >39)
LDL Chol Calc (NIH): 167 mg/dL — ABNORMAL HIGH (ref 0–99)
Triglycerides: 105 mg/dL (ref 0–149)
VLDL Cholesterol Cal: 19 mg/dL (ref 5–40)

## 2022-05-03 LAB — VITAMIN D 25 HYDROXY (VIT D DEFICIENCY, FRACTURES): Vit D, 25-Hydroxy: 23 ng/mL — ABNORMAL LOW (ref 30.0–100.0)

## 2022-05-03 MED ORDER — PHENTERMINE HCL 15 MG PO CAPS: 15.0000 mg | ORAL_CAPSULE | ORAL | 0 refills | Status: DC

## 2022-05-17 ENCOUNTER — Other Ambulatory Visit (HOSPITAL_COMMUNITY): Payer: Self-pay

## 2022-06-02 ENCOUNTER — Other Ambulatory Visit (HOSPITAL_COMMUNITY): Payer: Self-pay

## 2022-06-02 ENCOUNTER — Ambulatory Visit: Payer: Commercial Managed Care - PPO | Admitting: Podiatry

## 2022-06-02 DIAGNOSIS — M25872 Other specified joint disorders, left ankle and foot: Secondary | ICD-10-CM

## 2022-06-02 DIAGNOSIS — M778 Other enthesopathies, not elsewhere classified: Secondary | ICD-10-CM

## 2022-06-02 MED ORDER — MELOXICAM 15 MG PO TABS
15.0000 mg | ORAL_TABLET | Freq: Every day | ORAL | 0 refills | Status: AC
  Filled 2022-06-02: qty 30, 30d supply, fill #0

## 2022-06-02 NOTE — Progress Notes (Signed)
Subjective:  Patient ID: Cynthia Barron, female    DOB: 09-02-1965,  MRN: [id number]  Chief Complaint  Patient presents with   Foot Pain    Pt stated that she is still having discomfort in her foot     57 y.o. female presents with the above complaint.  Patient presents with follow-up of left sesamoiditis.  She states the pain started coming back.  She went to get it evaluated she did not get an injection last time she would like to do 1 at   Review of Systems: Negative except as noted in the HPI. Denies N/V/F/Ch.  Past Medical History:  Diagnosis Date   Seasonal allergies    Tachycardia     Current Outpatient Medications:    estradiol (ESTRACE) 1 MG tablet, Take 1 tablet (1 mg total) by mouth daily., Disp: 90 tablet, Rfl: 3   fluticasone (FLONASE) 50 MCG/ACT nasal spray, Place 2 sprays into both nostrils daily., Disp: 16 g, Rfl: 2   hydrOXYzine (VISTARIL) 25 MG capsule, Take 1 capsule (25 mg total) by mouth 3 (three) times daily as needed., Disp: 30 capsule, Rfl: 5   levocetirizine (XYZAL) 5 MG tablet, Take 1 tablet (5 mg total) by mouth every evening., Disp: 90 tablet, Rfl: 1   magnesium oxide (MAG-OX) 400 MG tablet, Take 1 tablet (400 mg total) by mouth daily., Disp: 90 tablet, Rfl: 2   meclizine (ANTIVERT) 25 MG tablet, Take 1 tablet by mouth 3 times a day, Disp: 60 tablet, Rfl: 1   metoprolol succinate (TOPROL-XL) 25 MG 24 hr tablet, Take 1 tablet (25 mg total) by mouth daily., Disp: 90 tablet, Rfl: 2   NOREL AD 4-10-325 MG TABS, Take 1 tablet by mouth 2 (two) times daily as needed., Disp: 30 tablet, Rfl: 0   nystatin powder, Apply 1 application topically 3 (three) times daily., Disp: 15 g, Rfl: 0   phentermine 15 MG capsule, Take 1 capsule (15 mg total) by mouth every morning., Disp: 30 capsule, Rfl: 0   progesterone (PROMETRIUM) 100 MG capsule, Take 1 capsule (100 mg total) by mouth at bedtime., Disp: 90 capsule, Rfl: 3   sertraline (ZOLOFT) 100 MG tablet, Take 1 tablet  (100 mg total) by mouth daily., Disp: 90 tablet, Rfl: 0   SUMAtriptan (IMITREX) 50 MG tablet, Take 1 tablet (50 mg total)  by mouth once as needed for migraine. May repeat in 2 hours if headache persists or recurs., Disp: 10 tablet, Rfl: 5   traZODone (DESYREL) 50 MG tablet, Take 1 tablet (50 mg total) by mouth at bedtime., Disp: 30 tablet, Rfl: 2  Social History   Tobacco Use  Smoking Status Never  Smokeless Tobacco Never    Allergies  Allergen Reactions   Dicyclomine Hcl     REACTION: Dizziness   Penicillins     REACTION: vomiting   Objective:  There were no vitals filed for this visit. There is no height or weight on file to calculate BMI. Constitutional Well developed. Well nourished.  Vascular Dorsalis pedis pulses palpable bilaterally. Posterior tibial pulses palpable bilaterally. Capillary refill normal to all digits.  No cyanosis or clubbing noted. Pedal hair growth normal.  Neurologic Normal speech. Oriented to person, place, and time. Epicritic sensation to light touch grossly present bilaterally.  Dermatologic Nails well groomed and normal in appearance. No open wounds. No skin lesions.  Orthopedic: Pain on palpation to the left sesamoidal complex.  Pain right at the tibial sesamoid.  No pain with dorsiflexion plantarflexion  of the metatarsophalangeal joint no intra-articular pain noted.   Radiographs: 3 views of skeletally mature adult left foot: No fracture noted.  No signs of bipartite sesamoid noted.  Bunion deformity noted moderate in nature. Assessment:   1. Sesamoiditis of left foot   2. Capsulitis of left foot     Plan:  Patient was evaluated and treated and all questions answered.  Left sesamoiditis with underlying capsulitis -All question and concerns were discussed with the patient in extensive detail.   -Patient has failed cam boot immobilization in the past.  Will plan on proceeding with a steroid injection as this has reoccurred and is causing  her more pain. -A steroid injection was performed at left sesamoid using 1% plain Lidocaine and 10 mg of Kenalog. This was well tolerated. -If there is no improvement we will discuss MRI during next visit  -Pes planovalgus -I explained to patient the etiology of pes planovalgus and relationship with Planter fasciitis and various treatment options were discussed.  Given patient foot structure in the setting of Planter fasciitis I believe patient will benefit from custom-made orthotics to help control the hindfoot motion support the arch of the foot and take the stress away from plantar fascial.  Patient agrees with the plan like to proceed with orthotics -Patient has obtained orthotics and is functioning well in the   No follow-ups on file.

## 2022-06-02 NOTE — Addendum Note (Signed)
Addended by: Nicholes Rough on: 06/02/2022 09:32 AM   Modules accepted: Orders

## 2022-06-07 ENCOUNTER — Ambulatory Visit: Payer: Commercial Managed Care - PPO | Admitting: Podiatry

## 2022-06-29 ENCOUNTER — Ambulatory Visit: Payer: Commercial Managed Care - PPO | Admitting: Podiatry

## 2022-06-30 ENCOUNTER — Other Ambulatory Visit: Payer: Self-pay

## 2022-06-30 ENCOUNTER — Other Ambulatory Visit (HOSPITAL_COMMUNITY): Payer: Self-pay

## 2022-06-30 ENCOUNTER — Other Ambulatory Visit: Payer: Self-pay | Admitting: Nurse Practitioner

## 2022-06-30 DIAGNOSIS — F419 Anxiety disorder, unspecified: Secondary | ICD-10-CM

## 2022-06-30 DIAGNOSIS — R000 Tachycardia, unspecified: Secondary | ICD-10-CM

## 2022-07-04 ENCOUNTER — Other Ambulatory Visit (HOSPITAL_COMMUNITY): Payer: Self-pay

## 2022-07-04 ENCOUNTER — Other Ambulatory Visit: Payer: Self-pay

## 2022-07-04 DIAGNOSIS — F419 Anxiety disorder, unspecified: Secondary | ICD-10-CM

## 2022-07-04 DIAGNOSIS — R000 Tachycardia, unspecified: Secondary | ICD-10-CM

## 2022-07-04 MED ORDER — METOPROLOL SUCCINATE ER 25 MG PO TB24
25.0000 mg | ORAL_TABLET | Freq: Every day | ORAL | 2 refills | Status: DC
  Filled 2022-07-04: qty 90, 90d supply, fill #0

## 2022-07-04 MED ORDER — SERTRALINE HCL 100 MG PO TABS
100.0000 mg | ORAL_TABLET | Freq: Every day | ORAL | 0 refills | Status: DC
  Filled 2022-07-04: qty 90, 90d supply, fill #0

## 2022-07-05 ENCOUNTER — Other Ambulatory Visit: Payer: Self-pay | Admitting: Nurse Practitioner

## 2022-07-05 MED ORDER — VITAMIN D (ERGOCALCIFEROL) 1.25 MG (50000 UNIT) PO CAPS
50000.0000 [IU] | ORAL_CAPSULE | ORAL | 1 refills | Status: DC
  Filled 2022-07-05 – 2022-09-16 (×2): qty 12, 84d supply, fill #0
  Filled 2022-12-08: qty 12, 84d supply, fill #1

## 2022-07-06 ENCOUNTER — Other Ambulatory Visit (HOSPITAL_COMMUNITY): Payer: Self-pay

## 2022-07-21 ENCOUNTER — Other Ambulatory Visit (HOSPITAL_COMMUNITY): Payer: Self-pay

## 2022-09-16 ENCOUNTER — Other Ambulatory Visit (HOSPITAL_COMMUNITY): Payer: Self-pay

## 2022-10-31 NOTE — Progress Notes (Unsigned)
Madelaine Bhat, CMA,acting as a Neurosurgeon for Cynthia Felts, FNP.,have documented all relevant documentation on the behalf of Cynthia Felts, FNP,as directed by  Cynthia Felts, FNP while in the presence of Cynthia Felts, FNP.  Subjective:  Patient ID: Cynthia Barron , female    DOB: April 20, 1965 , 57 y.o.   MRN: [id number]  No chief complaint on file.   HPI  Patient presents today for a Pre DM and chol follow up, patient reports compliance with medications. Patient denies any chest pain, SOB, or headaches. Patient has no concerns today.      Past Medical History:  Diagnosis Date   Seasonal allergies    Tachycardia      Family History  Problem Relation Age of Onset   Parkinson's disease Mother    Diabetes Mother    Hypertension Mother    Hyperlipidemia Mother    Hypertension Father    Hyperlipidemia Father      Current Outpatient Medications:    estradiol (ESTRACE) 1 MG tablet, Take 1 tablet (1 mg total) by mouth daily., Disp: 90 tablet, Rfl: 3   fluticasone (FLONASE) 50 MCG/ACT nasal spray, Place 2 sprays into both nostrils daily., Disp: 16 g, Rfl: 2   hydrOXYzine (VISTARIL) 25 MG capsule, Take 1 capsule (25 mg total) by mouth 3 (three) times daily as needed., Disp: 30 capsule, Rfl: 5   levocetirizine (XYZAL) 5 MG tablet, Take 1 tablet (5 mg total) by mouth every evening., Disp: 90 tablet, Rfl: 1   magnesium oxide (MAG-OX) 400 MG tablet, Take 1 tablet (400 mg total) by mouth daily., Disp: 90 tablet, Rfl: 2   meclizine (ANTIVERT) 25 MG tablet, Take 1 tablet by mouth 3 times a day, Disp: 60 tablet, Rfl: 1   meloxicam (MOBIC) 15 MG tablet, Take 1 tablet (15 mg total) by mouth daily., Disp: 30 tablet, Rfl: 0   metoprolol succinate (TOPROL-XL) 25 MG 24 hr tablet, Take 1 tablet (25 mg total) by mouth daily., Disp: 90 tablet, Rfl: 2   NOREL AD 4-10-325 MG TABS, Take 1 tablet by mouth 2 (two) times daily as needed., Disp: 30 tablet, Rfl: 0   nystatin powder, Apply 1 application topically 3  (three) times daily., Disp: 15 g, Rfl: 0   phentermine 15 MG capsule, Take 1 capsule (15 mg total) by mouth every morning., Disp: 30 capsule, Rfl: 0   progesterone (PROMETRIUM) 100 MG capsule, Take 1 capsule (100 mg total) by mouth at bedtime., Disp: 90 capsule, Rfl: 3   sertraline (ZOLOFT) 100 MG tablet, Take 1 tablet (100 mg total) by mouth daily., Disp: 90 tablet, Rfl: 0   SUMAtriptan (IMITREX) 50 MG tablet, Take 1 tablet (50 mg total)  by mouth once as needed for migraine. May repeat in 2 hours if headache persists or recurs., Disp: 10 tablet, Rfl: 5   traZODone (DESYREL) 50 MG tablet, Take 1 tablet (50 mg total) by mouth at bedtime., Disp: 30 tablet, Rfl: 2   Vitamin D, Ergocalciferol, (DRISDOL) 1.25 MG (50000 UNIT) CAPS capsule, Take 1 capsule (50,000 Units total) by mouth every 7 (seven) days., Disp: 12 capsule, Rfl: 1   Allergies  Allergen Reactions   Dicyclomine Hcl     REACTION: Dizziness   Penicillins     REACTION: vomiting     Review of Systems  Constitutional: Negative.   HENT: Negative.    Eyes: Negative.   Respiratory: Negative.    Cardiovascular: Negative.   Gastrointestinal: Negative.      There  were no vitals filed for this visit. There is no height or weight on file to calculate BMI.  Wt Readings from Last 3 Encounters:  05/02/22 158 lb (71.7 kg)  04/26/21 159 lb (72.1 kg)  03/15/21 161 lb 3.2 oz (73.1 kg)    The 10-year ASCVD risk score (Arnett DK, et al., 2019) is: 4%   Values used to calculate the score:     Age: 45 years     Sex: Female     Is Non-Hispanic African American: Yes     Diabetic: No     Tobacco smoker: No     Systolic Blood Pressure: 116 mmHg     Is BP treated: No     HDL Cholesterol: 50 mg/dL     Total Cholesterol: 236 mg/dL  Objective:  Physical Exam      Assessment And Plan:  Mixed hyperlipidemia  Abnormal glucose    No follow-ups on file.  Patient was given opportunity to ask questions. Patient verbalized understanding of  the plan and was able to repeat key elements of the plan. All questions were answered to their satisfaction.    Jeanell Sparrow, FNP, have reviewed all documentation for this visit. The documentation on 10/31/22 for the exam, diagnosis, procedures, and orders are all accurate and complete.   IF YOU HAVE BEEN REFERRED TO A SPECIALIST, IT MAY TAKE 1-2 WEEKS TO SCHEDULE/PROCESS THE REFERRAL. IF YOU HAVE NOT HEARD FROM US/SPECIALIST IN TWO WEEKS, PLEASE GIVE [place] A CALL AT [phone].

## 2022-11-02 ENCOUNTER — Encounter: Payer: Self-pay | Admitting: Nurse Practitioner

## 2022-11-02 ENCOUNTER — Ambulatory Visit: Payer: Commercial Managed Care - PPO | Admitting: Nurse Practitioner

## 2022-11-02 ENCOUNTER — Other Ambulatory Visit (HOSPITAL_COMMUNITY): Payer: Self-pay

## 2022-11-02 VITALS — BP 112/80 | HR 93 | Temp 98.3°F | Ht 63.0 in | Wt 163.0 lb

## 2022-11-02 DIAGNOSIS — F419 Anxiety disorder, unspecified: Secondary | ICD-10-CM | POA: Diagnosis not present

## 2022-11-02 DIAGNOSIS — E559 Vitamin D deficiency, unspecified: Secondary | ICD-10-CM | POA: Insufficient documentation

## 2022-11-02 DIAGNOSIS — R7309 Other abnormal glucose: Secondary | ICD-10-CM

## 2022-11-02 DIAGNOSIS — Z6828 Body mass index (BMI) 28.0-28.9, adult: Secondary | ICD-10-CM | POA: Diagnosis not present

## 2022-11-02 DIAGNOSIS — E782 Mixed hyperlipidemia: Secondary | ICD-10-CM | POA: Diagnosis not present

## 2022-11-02 DIAGNOSIS — Z2821 Immunization not carried out because of patient refusal: Secondary | ICD-10-CM | POA: Diagnosis not present

## 2022-11-02 DIAGNOSIS — R000 Tachycardia, unspecified: Secondary | ICD-10-CM

## 2022-11-02 MED ORDER — SERTRALINE HCL 100 MG PO TABS
100.0000 mg | ORAL_TABLET | Freq: Every day | ORAL | 0 refills | Status: DC
  Filled 2022-11-02: qty 90, 90d supply, fill #0

## 2022-11-02 MED ORDER — METOPROLOL SUCCINATE ER 25 MG PO TB24
25.0000 mg | ORAL_TABLET | Freq: Every day | ORAL | 2 refills | Status: DC
  Filled 2022-11-02: qty 90, 90d supply, fill #0
  Filled 2023-02-21: qty 90, 90d supply, fill #1
  Filled 2023-05-25: qty 90, 90d supply, fill #2

## 2022-11-02 NOTE — Assessment & Plan Note (Addendum)
Her cholesterol levels remain elevated and has not taken any red yeast rice. Discussed her risk for cardiac issues with poorly controlled hyperlipidemia. Will consider doing a calcium risk score and a referral to cardiology

## 2022-11-02 NOTE — Assessment & Plan Note (Signed)
Declines shingrix, educated on disease process and is aware if he changes his mind to notify office

## 2022-11-02 NOTE — Assessment & Plan Note (Signed)
Will check vitamin D level and supplement as needed.    Also encouraged to spend 15 minutes in the sun daily.

## 2022-11-02 NOTE — Assessment & Plan Note (Addendum)
Continue metoprolol, will consider a referral to Cardiology pending labs of her cholesterol and due to family history of MIs

## 2022-11-02 NOTE — Assessment & Plan Note (Signed)
Continues to be stable, continue current medications

## 2022-11-02 NOTE — Assessment & Plan Note (Signed)
Reports will obtain at work in October

## 2022-11-02 NOTE — Assessment & Plan Note (Signed)
HgbA1c is stable, encouraged to focus on healthy diet and regular exercise

## 2022-11-03 LAB — LIPID PANEL
Chol/HDL Ratio: 4.8 ratio — ABNORMAL HIGH (ref 0.0–4.4)
Cholesterol, Total: 236 mg/dL — ABNORMAL HIGH (ref 100–199)
HDL: 49 mg/dL (ref >39)
LDL Chol Calc (NIH): 167 mg/dL — ABNORMAL HIGH (ref 0–99)
Triglycerides: 114 mg/dL (ref 0–149)
VLDL Cholesterol Cal: 20 mg/dL (ref 5–40)

## 2022-11-03 LAB — HEMOGLOBIN A1C
Est. average glucose Bld gHb Est-mCnc: 126 mg/dL
Hgb A1c MFr Bld: 6 % — ABNORMAL HIGH (ref 4.8–5.6)

## 2022-11-03 LAB — VITAMIN D 25 HYDROXY (VIT D DEFICIENCY, FRACTURES): Vit D, 25-Hydroxy: 50.8 ng/mL (ref 30.0–100.0)

## 2022-11-03 LAB — LIPOPROTEIN A (LPA): Lipoprotein (a): 110.6 nmol/L — ABNORMAL HIGH (ref <75.0)

## 2022-11-25 ENCOUNTER — Other Ambulatory Visit (HOSPITAL_COMMUNITY): Payer: Self-pay

## 2023-02-21 ENCOUNTER — Other Ambulatory Visit: Payer: Self-pay | Admitting: Nurse Practitioner

## 2023-02-21 ENCOUNTER — Other Ambulatory Visit: Payer: Self-pay

## 2023-02-21 ENCOUNTER — Other Ambulatory Visit (HOSPITAL_COMMUNITY): Payer: Self-pay

## 2023-02-21 DIAGNOSIS — F419 Anxiety disorder, unspecified: Secondary | ICD-10-CM

## 2023-02-27 ENCOUNTER — Other Ambulatory Visit: Payer: Self-pay

## 2023-02-27 ENCOUNTER — Other Ambulatory Visit (HOSPITAL_COMMUNITY): Payer: Self-pay

## 2023-02-27 DIAGNOSIS — F419 Anxiety disorder, unspecified: Secondary | ICD-10-CM

## 2023-02-27 MED ORDER — SERTRALINE HCL 100 MG PO TABS
100.0000 mg | ORAL_TABLET | Freq: Every day | ORAL | 0 refills | Status: DC
  Filled 2023-02-27: qty 90, 90d supply, fill #0

## 2023-03-01 ENCOUNTER — Other Ambulatory Visit (HOSPITAL_COMMUNITY): Payer: Self-pay

## 2023-03-01 MED ORDER — VITAMIN D (ERGOCALCIFEROL) 1.25 MG (50000 UNIT) PO CAPS
50000.0000 [IU] | ORAL_CAPSULE | ORAL | 1 refills | Status: DC
  Filled 2023-03-01: qty 12, 84d supply, fill #0
  Filled 2023-05-25: qty 12, 84d supply, fill #1

## 2023-03-02 ENCOUNTER — Other Ambulatory Visit (HOSPITAL_COMMUNITY): Payer: Self-pay

## 2023-03-02 DIAGNOSIS — Z1231 Encounter for screening mammogram for malignant neoplasm of breast: Secondary | ICD-10-CM | POA: Diagnosis not present

## 2023-03-02 DIAGNOSIS — R319 Hematuria, unspecified: Secondary | ICD-10-CM | POA: Diagnosis not present

## 2023-03-02 DIAGNOSIS — Z01419 Encounter for gynecological examination (general) (routine) without abnormal findings: Secondary | ICD-10-CM | POA: Diagnosis not present

## 2023-03-02 DIAGNOSIS — Z6828 Body mass index (BMI) 28.0-28.9, adult: Secondary | ICD-10-CM | POA: Diagnosis not present

## 2023-03-02 MED ORDER — PROGESTERONE MICRONIZED 100 MG PO CAPS
100.0000 mg | ORAL_CAPSULE | Freq: Every day | ORAL | 3 refills | Status: AC
  Filled 2023-03-02: qty 90, 90d supply, fill #0
  Filled 2023-07-05: qty 90, 90d supply, fill #1
  Filled 2023-10-11: qty 90, 90d supply, fill #2
  Filled 2024-01-18: qty 90, 90d supply, fill #3

## 2023-03-02 MED ORDER — ESTRADIOL 2 MG PO TABS
2.0000 mg | ORAL_TABLET | Freq: Every day | ORAL | 3 refills | Status: DC
  Filled 2023-03-02: qty 30, 30d supply, fill #0
  Filled 2023-03-29: qty 30, 30d supply, fill #1

## 2023-03-07 LAB — HM MAMMOGRAPHY: HM Mammogram: NORMAL (ref 0–4)

## 2023-03-29 ENCOUNTER — Other Ambulatory Visit: Payer: Self-pay | Admitting: Nurse Practitioner

## 2023-03-29 DIAGNOSIS — R519 Headache, unspecified: Secondary | ICD-10-CM

## 2023-03-30 ENCOUNTER — Other Ambulatory Visit: Payer: Self-pay

## 2023-04-07 ENCOUNTER — Other Ambulatory Visit (HOSPITAL_COMMUNITY): Payer: Self-pay

## 2023-04-13 DIAGNOSIS — Z79890 Hormone replacement therapy (postmenopausal): Secondary | ICD-10-CM | POA: Diagnosis not present

## 2023-04-17 DIAGNOSIS — R3121 Asymptomatic microscopic hematuria: Secondary | ICD-10-CM | POA: Diagnosis not present

## 2023-05-03 ENCOUNTER — Other Ambulatory Visit (HOSPITAL_COMMUNITY): Payer: Self-pay

## 2023-05-03 ENCOUNTER — Ambulatory Visit: Payer: Commercial Managed Care - PPO | Admitting: Nurse Practitioner

## 2023-05-03 ENCOUNTER — Encounter: Payer: Self-pay | Admitting: Nurse Practitioner

## 2023-05-03 VITALS — BP 136/80 | HR 73 | Temp 98.2°F | Ht 63.0 in | Wt 173.4 lb

## 2023-05-03 DIAGNOSIS — Z0000 Encounter for general adult medical examination without abnormal findings: Secondary | ICD-10-CM

## 2023-05-03 DIAGNOSIS — F419 Anxiety disorder, unspecified: Secondary | ICD-10-CM

## 2023-05-03 DIAGNOSIS — E782 Mixed hyperlipidemia: Secondary | ICD-10-CM | POA: Diagnosis not present

## 2023-05-03 DIAGNOSIS — E66811 Obesity, class 1: Secondary | ICD-10-CM

## 2023-05-03 DIAGNOSIS — R7309 Other abnormal glucose: Secondary | ICD-10-CM

## 2023-05-03 DIAGNOSIS — Z780 Asymptomatic menopausal state: Secondary | ICD-10-CM

## 2023-05-03 DIAGNOSIS — Z6830 Body mass index (BMI) 30.0-30.9, adult: Secondary | ICD-10-CM | POA: Diagnosis not present

## 2023-05-03 DIAGNOSIS — E559 Vitamin D deficiency, unspecified: Secondary | ICD-10-CM | POA: Diagnosis not present

## 2023-05-03 DIAGNOSIS — E6609 Other obesity due to excess calories: Secondary | ICD-10-CM | POA: Diagnosis not present

## 2023-05-03 DIAGNOSIS — R000 Tachycardia, unspecified: Secondary | ICD-10-CM

## 2023-05-03 DIAGNOSIS — Z79899 Other long term (current) drug therapy: Secondary | ICD-10-CM | POA: Diagnosis not present

## 2023-05-03 MED ORDER — PAROXETINE HCL 20 MG PO TABS
20.0000 mg | ORAL_TABLET | Freq: Every day | ORAL | 1 refills | Status: DC
  Filled 2023-05-03: qty 90, 90d supply, fill #0
  Filled 2023-08-08: qty 90, 90d supply, fill #1

## 2023-05-03 MED ORDER — PAROXETINE HCL 20 MG PO TABS
20.0000 mg | ORAL_TABLET | Freq: Every day | ORAL | 1 refills | Status: DC
Start: 2023-05-03 — End: 2023-05-03
  Filled 2023-05-03: qty 90, 90d supply, fill #0

## 2023-05-03 MED ORDER — HYDROXYZINE PAMOATE 25 MG PO CAPS
25.0000 mg | ORAL_CAPSULE | Freq: Three times a day (TID) | ORAL | 5 refills | Status: DC | PRN
  Filled 2023-05-03: qty 30, 10d supply, fill #0

## 2023-05-03 NOTE — Assessment & Plan Note (Signed)
 Will change her Sertraline to paroxetine to see if has better affect and may also help with her menopause symptoms.

## 2023-05-03 NOTE — Progress Notes (Signed)
 Cynthia Barron,Cynthia Barron, CMA,acting as a Neurosurgeon for Cynthia Felts, FNP.,have documented all relevant documentation on the behalf of Cynthia Felts, FNP,as directed by  Cynthia Felts, FNP while in the presence of Cynthia Felts, FNP.  Subjective:    Patient ID: Cynthia Barron , female    DOB: [birth year] , 58 y.o.   MRN: [id number]  Chief Complaint  Patient presents with   Annual Exam   Hyperlipidemia    HPI  Patient presents today for annual exam. She reports compliance with medications. Denies headache, chest pain & sob. She is having a lot of issues with hot flashes and menopausal symptoms, seen GYN recently and increased estrodiol medication to 2mg .  With this she had abdominal cramping and spotting, she stopped 3 weeks ago by tapering off.  Advised to make GYN aware of stopping medication and to ask for veyozah for menopause symptoms.  Need refills on other medications.  Taking natural supplements for cholesterol.  Diet has not been going well, trying to start keto diet again, not currently exercising.  Has been feeling more anxious lately, over past 2 months it has been worst, nervous at work, affecting job performance. GAD 7 score of 7 today, down from last visit but still elevated.    Works at [employer].  Does endorse drinking more ice coffee with sugar free creamer.  Gyn referral to urology for blood in urine, goes back in May for follow up.  Microscopic blood only, not seen by patient in urine.       Past Medical History:  Diagnosis Date   Seasonal allergies    Tachycardia      Family History  Problem Relation Age of Onset   Parkinson's disease Mother    Diabetes Mother    Hypertension Mother    Hyperlipidemia Mother    Hypertension Father    Hyperlipidemia Father      Current Outpatient Medications:    estradiol (ESTRACE) 2 MG tablet, Take 1 tablet (2 mg total) by mouth daily., Disp: 30 tablet, Rfl: 3   fluticasone (FLONASE) 50 MCG/ACT nasal spray, Place 2 sprays  into both nostrils daily., Disp: 16 g, Rfl: 2   levocetirizine (XYZAL) 5 MG tablet, Take 1 tablet (5 mg total) by mouth every evening., Disp: 90 tablet, Rfl: 1   magnesium oxide (MAG-OX) 400 MG tablet, Take 1 tablet (400 mg total) by mouth daily., Disp: 90 tablet, Rfl: 2   meclizine (ANTIVERT) 25 MG tablet, Take 1 tablet by mouth 3 times a day, Disp: 60 tablet, Rfl: 1   meloxicam (MOBIC) 15 MG tablet, Take 1 tablet (15 mg total) by mouth daily., Disp: 30 tablet, Rfl: 0   metoprolol succinate (TOPROL-XL) 25 MG 24 hr tablet, Take 1 tablet (25 mg total) by mouth daily., Disp: 90 tablet, Rfl: 2   NOREL AD 4-10-325 MG TABS, Take 1 tablet by mouth 2 (two) times daily as needed., Disp: 30 tablet, Rfl: 0   nystatin powder, Apply 1 application topically 3 (three) times daily., Disp: 15 g, Rfl: 0   progesterone (PROMETRIUM) 100 MG capsule, Take 1 capsule (100 mg total) by mouth at bedtime., Disp: 90 capsule, Rfl: 3   SUMAtriptan (IMITREX) 50 MG tablet, Take 1 tablet (50 mg total)  by mouth once as needed for migraine. May repeat in 2 hours if headache persists or recurs., Disp: 10 tablet, Rfl: 5   traZODone (DESYREL) 50 MG tablet, Take 1 tablet (50 mg total) by mouth at bedtime., Disp: 30  tablet, Rfl: 2   Vitamin D, Ergocalciferol, (DRISDOL) 1.25 MG (50000 UNIT) CAPS capsule, Take 1 capsule (50,000 Units total) by mouth every 7 (seven) days., Disp: 12 capsule, Rfl: 1   hydrOXYzine (VISTARIL) 25 MG capsule, Take 1 capsule (25 mg total) by mouth 3 (three) times daily as needed., Disp: 30 capsule, Rfl: 5   PARoxetine (PAXIL) 20 MG tablet, Take 1 tablet (20 mg total) by mouth daily., Disp: 90 tablet, Rfl: 1   Allergies  Allergen Reactions   Dicyclomine Hcl     REACTION: Dizziness   Penicillins     REACTION: vomiting      The patient states she uses post menopausal status for birth control. No LMP recorded (lmp unknown). Patient is postmenopausal.. Negative for Dysmenorrhea and Negative for Menorrhagia.  Negative for: breast discharge, breast lump(s), breast pain and breast self exam. Associated symptoms include abnormal vaginal bleeding. Pertinent negatives include abnormal bleeding (hematology), anxiety, decreased libido, depression, difficulty falling sleep, dyspareunia, history of infertility, nocturia, sexual dysfunction, sleep disturbances, urinary incontinence, urinary urgency, vaginal discharge and vaginal itching. Diet regular.The patient states her exercise level is    . The patient's tobacco use is:  Social History   Tobacco Use  Smoking Status Never  Smokeless Tobacco Never  . She has been exposed to passive smoke. The patient's alcohol use is:  Social History   Substance and Sexual Activity  Alcohol Use Not Currently   Additional information: Last pap 02/23/2022, next one scheduled for 02/23/2025.    Review of Systems  Constitutional: Negative.   HENT: Negative.    Eyes: Negative.   Respiratory: Negative.    Cardiovascular: Negative.   Gastrointestinal: Negative.   Endocrine: Negative.   Genitourinary: Negative.   Musculoskeletal: Negative.   Skin: Negative.   Allergic/Immunologic: Negative.   Neurological: Negative.   Hematological: Negative.   Psychiatric/Behavioral: Negative.       Today's Vitals   05/03/23 0839  BP: 136/80  Pulse: 73  Temp: 98.2 F (36.8 C)  SpO2: 98%  Weight: 173 lb 6.4 oz (78.7 kg)  Height: 5\' 3"  (1.6 m)   Body mass index is 30.72 kg/m.  Wt Readings from Last 3 Encounters:  05/03/23 173 lb 6.4 oz (78.7 kg)  11/02/22 163 lb (73.9 kg)  05/02/22 158 lb (71.7 kg)     Objective:  Physical Exam Constitutional:      General: She is not in acute distress.    Appearance: Normal appearance. She is obese.  HENT:     Head: Normocephalic and atraumatic.     Right Ear: Tympanic membrane, ear canal and external ear normal. There is no impacted cerumen.     Left Ear: Tympanic membrane, ear canal and external ear normal. There is no impacted  cerumen.     Nose: Nose normal.     Mouth/Throat:     Mouth: Mucous membranes are moist.     Pharynx: Oropharynx is clear.  Eyes:     Extraocular Movements: Extraocular movements intact.     Conjunctiva/sclera: Conjunctivae normal.     Pupils: Pupils are equal, round, and reactive to light.  Cardiovascular:     Rate and Rhythm: Normal rate and regular rhythm.     Pulses: Normal pulses.     Heart sounds: Normal heart sounds. No murmur heard. Pulmonary:     Effort: Pulmonary effort is normal. No respiratory distress.     Breath sounds: Normal breath sounds. No wheezing.  Abdominal:     General: Bowel  sounds are normal. There is no distension.     Palpations: Abdomen is soft.  Musculoskeletal:        General: Normal range of motion.     Cervical back: Normal range of motion.  Skin:    General: Skin is warm and dry.     Capillary Refill: Capillary refill takes 2 to 3 seconds.  Neurological:     General: No focal deficit present.     Mental Status: She is alert and oriented to person, place, and time. Mental status is at baseline.     Cranial Nerves: No cranial nerve deficit.     Motor: No weakness.  Psychiatric:        Mood and Affect: Mood is anxious.        Behavior: Behavior normal.        Thought Content: Thought content normal.        Judgment: Judgment normal.          05/03/2023    8:40 AM 05/02/2022    8:45 AM 02/10/2021    4:02 PM 09/09/2019    2:38 PM 05/17/2018    9:23 AM  Depression screen PHQ 2/9  Decreased Interest 0 0 0 0 0  Down, Depressed, Hopeless 0 0 0 0 0  PHQ - 2 Score 0 0 0 0 0  Altered sleeping 0  0    Tired, decreased energy 0  0    Change in appetite 0  0    Feeling bad or failure about yourself  0  0    Trouble concentrating 0  0    Moving slowly or fidgety/restless 0  0    Suicidal thoughts 0  0    PHQ-9 Score 0  0    Difficult doing work/chores Not difficult at all          05/03/2023    8:42 AM 09/09/2019    3:28 PM  GAD 7 :  Generalized Anxiety Score  Nervous, Anxious, on Edge 3 2  Control/stop worrying 0 1  Worry too much - different things 0 3  Trouble relaxing 0 3  Restless 0 0  Easily annoyed or irritable 3 1  Afraid - awful might happen 1 2  Total GAD 7 Score 7 12  Anxiety Difficulty Somewhat difficult Somewhat difficult        Assessment And Plan:     Routine general medical examination at health care facility  Mixed hyperlipidemia Assessment & Plan: Her cholesterol levels remain elevated she is taking red yeast rice and her lipoprotein was slightly elevated, will recheck lipid panel   Orders: -     CMP14+EGFR -     Lipid panel  Abnormal glucose Assessment & Plan: HgbA1c is stable, encouraged to focus on healthy diet and regular exercise  Orders: -     Hemoglobin A1c  Vitamin D deficiency Assessment & Plan: Will check vitamin D level and supplement as needed.    Also encouraged to spend 15 minutes in the sun daily.    Orders: -     VITAMIN D 25 Hydroxy (Vit-D Deficiency, Fractures)  Anxiety Assessment & Plan: Will change her Sertraline to paroxetine to see if has better affect and may also help with her menopause symptoms.   Orders: -     PARoxetine HCl; Take 1 tablet (20 mg total) by mouth daily.  Dispense: 90 tablet; Refill: 1  Tachycardia Assessment & Plan: Continue metoprolol, may need to decrease after starting Paxil.  Menopause Assessment & Plan: Symptoms are worsening, will try her on paxil. Return to office in 6-8 weeks for medication f/u  Orders: -     PARoxetine HCl; Take 1 tablet (20 mg total) by mouth daily.  Dispense: 90 tablet; Refill: 1  Class 1 obesity due to excess calories with serious comorbidity and body mass index (BMI) of 30.0 to 30.9 in adult Assessment & Plan: She is encouraged to strive for BMI less than 30 to decrease cardiac risk. Advised to aim for at least 150 minutes of exercise per week.    Other long term (current) drug therapy -      CBC  Other orders -     hydrOXYzine Pamoate; Take 1 capsule (25 mg total) by mouth 3 (three) times daily as needed.  Dispense: 30 capsule; Refill: 5  She is encouraged to strive for BMI less than 30 to decrease cardiac risk. Advised to aim for at least 150 minutes of exercise per week.    Return for 1 YEAR HM, 6 MONTH CHOL F/U; 6 week f/u new medication. Patient was given opportunity to ask questions. Patient verbalized understanding of the plan and was able to repeat key elements of the plan. All questions were answered to their satisfaction.   Cynthia Barron have reviewed this encounter including the documentation in this note and/or discussed this patient with the Ezra Sites FNP Student. Cynthia Barron am certifying that Cynthia Barron agree with the content of this note as the primary care nurse practitioner.  Cynthia Felts, DNP, FNP-BC   Cynthia Felts, FNP   Cynthia Barron, Cynthia Felts, FNP, have reviewed all documentation for this visit. The documentation on 05/03/23 for the exam, diagnosis, procedures, and orders are all accurate and complete.

## 2023-05-03 NOTE — Patient Instructions (Addendum)
 We will wean you off the Sertraline - the first week take every other day then take every 2 days, on the 3rd week take daily. Let me know if you have any questions.  Goal to exercise 150 minutes per week with at least 2 days of strength training Encouraged to park further when at the store, take stairs instead of elevators and to walk in place during commercials. Increase water intake to at least one gallon of water daily.

## 2023-05-03 NOTE — Assessment & Plan Note (Signed)
 Her cholesterol levels remain elevated she is taking red yeast rice and her lipoprotein was slightly elevated, will recheck lipid panel

## 2023-05-03 NOTE — Assessment & Plan Note (Signed)
 Continue metoprolol, may need to decrease after starting Paxil.

## 2023-05-04 ENCOUNTER — Encounter: Payer: Self-pay | Admitting: Nurse Practitioner

## 2023-05-04 LAB — CMP14+EGFR
ALT: 18 IU/L (ref 0–32)
AST: 25 IU/L (ref 0–40)
Albumin: 4.5 g/dL (ref 3.8–4.9)
Alkaline Phosphatase: 75 IU/L (ref 44–121)
BUN/Creatinine Ratio: 13 (ref 9–23)
BUN: 11 mg/dL (ref 6–24)
Bilirubin Total: 0.3 mg/dL (ref 0.0–1.2)
CO2: 20 mmol/L (ref 20–29)
Calcium: 9 mg/dL (ref 8.7–10.2)
Chloride: 103 mmol/L (ref 96–106)
Creatinine, Ser: 0.83 mg/dL (ref 0.57–1.00)
Globulin, Total: 2.2 g/dL (ref 1.5–4.5)
Glucose: 98 mg/dL (ref 70–99)
Potassium: 4.3 mmol/L (ref 3.5–5.2)
Sodium: 139 mmol/L (ref 134–144)
Total Protein: 6.7 g/dL (ref 6.0–8.5)
eGFR: 81 mL/min/{1.73_m2} (ref >59)

## 2023-05-04 LAB — LIPID PANEL
Chol/HDL Ratio: 4.2 ratio (ref 0.0–4.4)
Cholesterol, Total: 207 mg/dL — ABNORMAL HIGH (ref 100–199)
HDL: 49 mg/dL (ref >39)
LDL Chol Calc (NIH): 143 mg/dL — ABNORMAL HIGH (ref 0–99)
Triglycerides: 84 mg/dL (ref 0–149)
VLDL Cholesterol Cal: 15 mg/dL (ref 5–40)

## 2023-05-04 LAB — CBC
Hematocrit: 40.7 % (ref 34.0–46.6)
Hemoglobin: 13.1 g/dL (ref 11.1–15.9)
MCH: 29.9 pg (ref 26.6–33.0)
MCHC: 32.2 g/dL (ref 31.5–35.7)
MCV: 93 fL (ref 79–97)
Platelets: 289 10*3/uL (ref 150–450)
RBC: 4.38 x10E6/uL (ref 3.77–5.28)
RDW: 12.2 % (ref 11.7–15.4)
WBC: 4.3 10*3/uL (ref 3.4–10.8)

## 2023-05-04 LAB — HEMOGLOBIN A1C
Est. average glucose Bld gHb Est-mCnc: 117 mg/dL
Hgb A1c MFr Bld: 5.7 % — ABNORMAL HIGH (ref 4.8–5.6)

## 2023-05-04 LAB — VITAMIN D 25 HYDROXY (VIT D DEFICIENCY, FRACTURES): Vit D, 25-Hydroxy: 53.6 ng/mL (ref 30.0–100.0)

## 2023-05-14 NOTE — Assessment & Plan Note (Signed)
 Symptoms are worsening, will try her on paxil. Return to office in 6-8 weeks for medication f/u

## 2023-05-14 NOTE — Assessment & Plan Note (Signed)
 She is encouraged to strive for BMI less than 30 to decrease cardiac risk. Advised to aim for at least 150 minutes of exercise per week.

## 2023-05-14 NOTE — Assessment & Plan Note (Signed)

## 2023-05-14 NOTE — Assessment & Plan Note (Signed)
HgbA1c is stable, encouraged to focus on healthy diet and regular exercise

## 2023-05-14 NOTE — Assessment & Plan Note (Signed)
 Will check vitamin D level and supplement as needed.    Also encouraged to spend 15 minutes in the sun daily.

## 2023-05-25 ENCOUNTER — Other Ambulatory Visit (HOSPITAL_COMMUNITY): Payer: Self-pay

## 2023-05-25 ENCOUNTER — Other Ambulatory Visit: Payer: Self-pay

## 2023-05-25 ENCOUNTER — Other Ambulatory Visit: Payer: Self-pay | Admitting: Nurse Practitioner

## 2023-05-25 DIAGNOSIS — R519 Headache, unspecified: Secondary | ICD-10-CM

## 2023-05-25 MED ORDER — SUMATRIPTAN SUCCINATE 50 MG PO TABS
50.0000 mg | ORAL_TABLET | ORAL | 5 refills | Status: AC
  Filled 2023-05-25: qty 10, 30d supply, fill #0
  Filled 2023-08-08: qty 10, 30d supply, fill #1
  Filled 2023-10-15: qty 10, 30d supply, fill #2
  Filled 2024-01-18: qty 10, 30d supply, fill #3
  Filled 2024-03-19: qty 10, 30d supply, fill #4

## 2023-06-14 ENCOUNTER — Other Ambulatory Visit (HOSPITAL_COMMUNITY): Payer: Self-pay

## 2023-06-14 ENCOUNTER — Encounter: Payer: Self-pay | Admitting: Nurse Practitioner

## 2023-06-14 ENCOUNTER — Ambulatory Visit: Admitting: Nurse Practitioner

## 2023-06-14 VITALS — BP 118/80 | HR 74 | Temp 99.0°F | Ht 63.0 in | Wt 173.0 lb

## 2023-06-14 DIAGNOSIS — Z6830 Body mass index (BMI) 30.0-30.9, adult: Secondary | ICD-10-CM

## 2023-06-14 DIAGNOSIS — Z780 Asymptomatic menopausal state: Secondary | ICD-10-CM | POA: Diagnosis not present

## 2023-06-14 DIAGNOSIS — Z8249 Family history of ischemic heart disease and other diseases of the circulatory system: Secondary | ICD-10-CM | POA: Diagnosis not present

## 2023-06-14 DIAGNOSIS — E782 Mixed hyperlipidemia: Secondary | ICD-10-CM | POA: Diagnosis not present

## 2023-06-14 DIAGNOSIS — F419 Anxiety disorder, unspecified: Secondary | ICD-10-CM | POA: Diagnosis not present

## 2023-06-14 DIAGNOSIS — E66811 Obesity, class 1: Secondary | ICD-10-CM | POA: Diagnosis not present

## 2023-06-14 DIAGNOSIS — R079 Chest pain, unspecified: Secondary | ICD-10-CM

## 2023-06-14 DIAGNOSIS — E6609 Other obesity due to excess calories: Secondary | ICD-10-CM | POA: Diagnosis not present

## 2023-06-14 NOTE — Patient Instructions (Addendum)
 Start Buspirone first, then start atorvastatin two weeks later.  Continue taking the red yeast rice during that two weeks.   Monitor for leg cramps when starting the atorvastatin, drink plenty of water  Call office before your next appointment if your symptoms get worse or don't improve, don't wait!

## 2023-06-14 NOTE — Assessment & Plan Note (Signed)
 Atorvastatin 10 mg PO daily started, repeat lipid panel at next visit

## 2023-06-14 NOTE — Progress Notes (Signed)
 [place], CMA,acting as a Neurosurgeon for Susanna Epley, FNP.,have documented all relevant documentation on the behalf of Susanna Epley, FNP,as directed by  Susanna Epley, FNP while in the presence of Susanna Epley, FNP.  Subjective:  Patient ID: Cynthia Barron , female    DOB: 16-Jan-1966 , 58 y.o.   MRN: [id number]  Chief Complaint  Patient presents with   Anxiety    Patient presents today for a paxil  follow up, Patient reports compliance with medication. Patient denies any chest pain, SOB, or headaches. Patient has no concerns today. Patient reports it is going good she has had no issues with it.    Menopause   Chest Pain    HPI  Patient presents for medication check for starting Paxil  6 weeks ago.  Endorses improvement of hot flashes, has not had improvement in anxiety symptoms.    Endorses intermittent, sharp, chest pains in chest that last less that 5 minutes.  This occurs during times of anxiety, rest and activity.  She states that she has had this for months now but has not mentioned it.  Family history of MI, sister and brother, brother before age 24.    Anxiety Presents for follow-up visit. Symptoms include chest pain (intermittent, rest and activity), depressed mood (decreased motivation), insomnia, irritability, nervous/anxious behavior and shortness of breath. Patient reports no hyperventilation, palpitations or panic. Symptoms occur most days. The severity of symptoms is interfering with daily activities and causing significant distress. The patient sleeps 8 hours per night. The quality of sleep is poor. Nighttime awakenings: one to two.   Compliance with medications is 76-100%.     Past Medical History:  Diagnosis Date   Seasonal allergies    Tachycardia      Family History  Problem Relation Age of Onset   Parkinson's disease Mother    Diabetes Mother    Hypertension Mother    Hyperlipidemia Mother    Hypertension Father    Hyperlipidemia Father      Current  Outpatient Medications:    atorvastatin  (LIPITOR) 10 MG tablet, Take 1 tablet (10 mg total) by mouth daily., Disp: 30 tablet, Rfl: 11   busPIRone  (BUSPAR ) 5 MG tablet, Take 1 tablet (5 mg total) by mouth daily., Disp: 30 tablet, Rfl: 2   fluticasone  (FLONASE ) 50 MCG/ACT nasal spray, Place 2 sprays into both nostrils daily., Disp: 16 g, Rfl: 2   hydrOXYzine  (VISTARIL ) 25 MG capsule, Take 1 capsule (25 mg total) by mouth 3 (three) times daily as needed., Disp: 30 capsule, Rfl: 5   levocetirizine (XYZAL ) 5 MG tablet, Take 1 tablet (5 mg total) by mouth every evening., Disp: 90 tablet, Rfl: 1   magnesium  oxide (MAG-OX) 400 MG tablet, Take 1 tablet (400 mg total) by mouth daily., Disp: 90 tablet, Rfl: 2   meclizine  (ANTIVERT ) 25 MG tablet, Take 1 tablet by mouth 3 times a day, Disp: 60 tablet, Rfl: 1   meloxicam  (MOBIC ) 15 MG tablet, Take 1 tablet (15 mg total) by mouth daily., Disp: 30 tablet, Rfl: 0   metoprolol  succinate (TOPROL -XL) 25 MG 24 hr tablet, Take 1 tablet (25 mg total) by mouth daily., Disp: 90 tablet, Rfl: 2   NOREL AD 4-10-325 MG TABS, Take 1 tablet by mouth 2 (two) times daily as needed., Disp: 30 tablet, Rfl: 0   nystatin  powder, Apply 1 application topically 3 (three) times daily., Disp: 15 g, Rfl: 0   PARoxetine  (PAXIL ) 20 MG tablet, Take 1 tablet (20 mg total)  by mouth daily., Disp: 90 tablet, Rfl: 1   progesterone  (PROMETRIUM ) 100 MG capsule, Take 1 capsule (100 mg total) by mouth at bedtime., Disp: 90 capsule, Rfl: 3   SUMAtriptan  (IMITREX ) 50 MG tablet, Take 1 tablet (50 mg total) by mouth as directed at onset of migraine, may repeat in 2hours if needed., Disp: 10 tablet, Rfl: 5   traZODone  (DESYREL ) 50 MG tablet, Take 1 tablet (50 mg total) by mouth at bedtime., Disp: 30 tablet, Rfl: 2   Vitamin D , Ergocalciferol , (DRISDOL ) 1.25 MG (50000 UNIT) CAPS capsule, Take 1 capsule (50,000 Units total) by mouth every 7 (seven) days., Disp: 12 capsule, Rfl: 1   Allergies  Allergen  Reactions   Dicyclomine Hcl     REACTION: Dizziness   Penicillins     REACTION: vomiting     Review of Systems  Constitutional:  Positive for fatigue and irritability. Negative for chills.  HENT:  Negative for congestion.   Respiratory:  Positive for shortness of breath.   Cardiovascular:  Positive for chest pain (intermittent, rest and activity). Negative for palpitations.  Neurological:  Positive for light-headedness and headaches. Negative for weakness.  Psychiatric/Behavioral:  Positive for agitation and sleep disturbance. The patient is nervous/anxious and has insomnia.      Today's Vitals   06/14/23 1019  BP: 118/80  Pulse: 74  Temp: 99 F (37.2 C)  TempSrc: Oral  Weight: 173 lb (78.5 kg)  Height: 5\' 3"  (1.6 m)  PainSc: 0-No pain   Body mass index is 30.65 kg/m.  Wt Readings from Last 3 Encounters:  06/14/23 173 lb (78.5 kg)  05/03/23 173 lb 6.4 oz (78.7 kg)  11/02/22 163 lb (73.9 kg)      Objective:  Physical Exam Vitals reviewed.  Constitutional:      Appearance: She is obese.  HENT:     Head: Normocephalic and atraumatic.  Cardiovascular:     Rate and Rhythm: Normal rate and regular rhythm.     Pulses:          Radial pulses are 2+ on the right side and 2+ on the left side.     Heart sounds: Normal heart sounds. Heart sounds not distant. No murmur heard. Pulmonary:     Effort: Pulmonary effort is normal. No tachypnea or respiratory distress.     Breath sounds: Normal breath sounds.  Chest:     Chest wall: No tenderness.  Skin:    General: Skin is warm and dry.     Capillary Refill: Capillary refill takes less than 2 seconds.  Neurological:     General: No focal deficit present.     Mental Status: She is alert.  Psychiatric:        Mood and Affect: Mood normal.        Behavior: Behavior normal.         Assessment And Plan:  Anxiety Assessment & Plan: Continue Paxil  20 mg PO daily, adding buspar  5 mg PO daily for anxiety  Orders: -      busPIRone  HCl; Take 1 tablet (5 mg total) by mouth daily.  Dispense: 30 tablet; Refill: 2  Menopause Assessment & Plan: Paxil  20 mg PO daily continued    Class 1 obesity due to excess calories with serious comorbidity and body mass index (BMI) of 30.0 to 30.9 in adult Assessment & Plan: She is encouraged to strive for BMI less than 30 to decrease cardiac risk. Advised to aim for at least 150 minutes of exercise  per week.    Chest pain at rest Assessment & Plan: EKG normal.  However due to strong family history of MI and a sibling having an MI before age 41 will refer to cardiology.  She is also willing to start Atorvastatin  10 mg PO daily for her elevated lipids.  Orders: -     EKG 12-Lead -     Atorvastatin  Calcium ; Take 1 tablet (10 mg total) by mouth daily.  Dispense: 30 tablet; Refill: 11 -     Ambulatory referral to Cardiology  Mixed hyperlipidemia Assessment & Plan: Atorvastatin  10 mg PO daily started, repeat lipid panel at next visit  Orders: -     Atorvastatin  Calcium ; Take 1 tablet (10 mg total) by mouth daily.  Dispense: 30 tablet; Refill: 11  Family history of MI (myocardial infarction) -     Atorvastatin  Calcium ; Take 1 tablet (10 mg total) by mouth daily.  Dispense: 30 tablet; Refill: 11 -     Ambulatory referral to Cardiology    Return in 8 weeks (on 08/09/2023) for KEEP SAME NEXT.  Patient was given opportunity to ask questions. Patient verbalized understanding of the plan and was able to repeat key elements of the plan. All questions were answered to their satisfaction.   I have reviewed this encounter including the documentation in this note and/or discussed this patient with Mickael Alamo FNP Student. I am certifying that I agree with the content of this note as the primary care nurse practitioner.  Susanna Epley, DNP, FNP-BC  I, Susanna Epley, FNP, have reviewed all documentation for this visit. The documentation on 06/14/23 for the exam, diagnosis,  procedures, and orders are all accurate and complete.   IF YOU HAVE BEEN REFERRED TO A SPECIALIST, IT MAY TAKE 1-2 WEEKS TO SCHEDULE/PROCESS THE REFERRAL. IF YOU HAVE NOT HEARD FROM US /SPECIALIST IN TWO WEEKS, PLEASE GIVE US  A CALL AT [phone].

## 2023-06-14 NOTE — Assessment & Plan Note (Signed)
 Continue Paxil  20 mg PO daily, adding buspar 5 mg PO daily for anxiety

## 2023-06-14 NOTE — Assessment & Plan Note (Signed)
 Paxil  20 mg PO daily continued

## 2023-06-15 ENCOUNTER — Other Ambulatory Visit (HOSPITAL_COMMUNITY): Payer: Self-pay

## 2023-06-15 ENCOUNTER — Other Ambulatory Visit: Payer: Self-pay

## 2023-06-15 MED ORDER — ATORVASTATIN CALCIUM 10 MG PO TABS
10.0000 mg | ORAL_TABLET | Freq: Every day | ORAL | 11 refills | Status: DC
  Filled 2023-06-15: qty 30, 30d supply, fill #0

## 2023-06-15 MED ORDER — BUSPIRONE HCL 5 MG PO TABS
5.0000 mg | ORAL_TABLET | Freq: Every day | ORAL | 2 refills | Status: DC
  Filled 2023-06-15: qty 30, 30d supply, fill #0
  Filled 2023-07-11: qty 30, 30d supply, fill #1
  Filled 2023-08-10: qty 30, 30d supply, fill #2

## 2023-06-25 ENCOUNTER — Encounter: Payer: Self-pay | Admitting: Nurse Practitioner

## 2023-06-25 NOTE — Assessment & Plan Note (Signed)
 She is encouraged to strive for BMI less than 30 to decrease cardiac risk. Advised to aim for at least 150 minutes of exercise per week.

## 2023-06-25 NOTE — Assessment & Plan Note (Signed)
 EKG normal.  However due to strong family history of MI and a sibling having an MI before age 60 will refer to cardiology.  She is also willing to start Atorvastatin  10 mg PO daily for her elevated lipids.

## 2023-07-05 NOTE — Progress Notes (Signed)
 [name] FNP Reason for referral-chest pain  HPI: Follow-up 58 year old female for evaluation of chest pain at request of Cynthia Epley FNP.  Patient also states that over the past 6 months she has had occasional chest pain.  It is sharp and in the substernal/left upper chest area.  Lasts 1 to 2 seconds.  No radiation or associated symptoms.  She does not have exertional chest pain otherwise.  She has dyspnea with more vigorous activities but not routine activities.  No orthopnea, PND, pedal edema or syncope.  Current Outpatient Medications  Medication Sig Dispense Refill   atorvastatin  (LIPITOR) 10 MG tablet Take 1 tablet (10 mg total) by mouth daily. 30 tablet 11   busPIRone  (BUSPAR ) 5 MG tablet Take 1 tablet (5 mg total) by mouth daily. 30 tablet 2   fluticasone  (FLONASE ) 50 MCG/ACT nasal spray Place 2 sprays into both nostrils daily. 16 g 2   hydrOXYzine  (VISTARIL ) 25 MG capsule Take 1 capsule (25 mg total) by mouth 3 (three) times daily as needed. 30 capsule 5   levocetirizine (XYZAL ) 5 MG tablet Take 1 tablet (5 mg total) by mouth every evening. 90 tablet 1   magnesium  oxide (MAG-OX) 400 MG tablet Take 1 tablet (400 mg total) by mouth daily. 90 tablet 2   meclizine  (ANTIVERT ) 25 MG tablet Take 1 tablet by mouth 3 times a day 60 tablet 1   meloxicam  (MOBIC ) 15 MG tablet Take 1 tablet (15 mg total) by mouth daily. 30 tablet 0   metoprolol  succinate (TOPROL -XL) 25 MG 24 hr tablet Take 1 tablet (25 mg total) by mouth daily. 90 tablet 2   NOREL AD 4-10-325 MG TABS Take 1 tablet by mouth 2 (two) times daily as needed. 30 tablet 0   nystatin  powder Apply 1 application topically 3 (three) times daily. 15 g 0   PARoxetine  (PAXIL ) 20 MG tablet Take 1 tablet (20 mg total) by mouth daily. 90 tablet 1   progesterone  (PROMETRIUM ) 100 MG capsule Take 1 capsule (100 mg total) by mouth at bedtime. 90 capsule 3   SUMAtriptan  (IMITREX ) 50 MG tablet Take 1 tablet (50 mg total) by mouth as  directed at onset of migraine, may repeat in 2hours if needed. 10 tablet 5   traZODone  (DESYREL ) 50 MG tablet Take 1 tablet (50 mg total) by mouth at bedtime. 30 tablet 2   Vitamin D , Ergocalciferol , (DRISDOL ) 1.25 MG (50000 UNIT) CAPS capsule Take 1 capsule (50,000 Units total) by mouth every 7 (seven) days. 12 capsule 1   No current facility-administered medications for this visit.    Allergies  Allergen Reactions   Dicyclomine Hcl     REACTION: Dizziness   Penicillins     REACTION: vomiting     Past Medical History:  Diagnosis Date   Seasonal allergies    Tachycardia     Past Surgical History:  Procedure Laterality Date   SHOULDER SURGERY      Social History   Socioeconomic History   Marital status: Married    Spouse name: Not on file   Number of children: Not on file   Years of education: Not on file   Highest education level: Associate degree: academic program  Occupational History   Not on file  Tobacco Use   Smoking status: Never   Smokeless tobacco: Never  Substance and Sexual Activity   Alcohol use: Not Currently   Drug use: Never   Sexual activity: Not on file  Other Topics  Concern   Not on file  Social History Narrative   Not on file   Social Drivers of Health   Financial Resource Strain: Low Risk  (06/14/2023)   Overall Financial Resource Strain (CARDIA)    Difficulty of Paying Living Expenses: Not hard at all  Food Insecurity: No Food Insecurity (06/14/2023)   Hunger Vital Sign    Worried About Running Out of Food in the Last Year: Never true    Ran Out of Food in the Last Year: Never true  Transportation Needs: No Transportation Needs (06/14/2023)   PRAPARE - Administrator, Civil Service (Medical): No    Lack of Transportation (Non-Medical): No  Physical Activity: Unknown (06/14/2023)   Exercise Vital Sign    Days of Exercise per Week: 0 days    Minutes of Exercise per Session: Not on file  Stress: Stress Concern Present  (06/14/2023)   [employer] of Occupational Health - Occupational Stress Questionnaire    Feeling of Stress : To some extent  Social Connections: Socially Isolated (06/14/2023)   Social Connection and Isolation Panel [NHANES]    Frequency of Communication with Friends and Family: Once a week    Frequency of Social Gatherings with Friends and Family: Once a week    Attends Religious Services: Never    Database administrator or Organizations: No    Attends Engineer, structural: Not on file    Marital Status: Married  Catering manager Violence: Not on file    Family History  Problem Relation Age of Onset   Parkinson's disease Mother    Diabetes Mother    Hypertension Mother    Hyperlipidemia Mother    Hypertension Father    Hyperlipidemia Father     ROS: no fevers or chills, productive cough, hemoptysis, dysphasia, odynophagia, melena, hematochezia, dysuria, hematuria, rash, seizure activity, orthopnea, PND, pedal edema, claudication. Remaining systems are negative.  Physical Exam:   There were no vitals taken for this visit.  General:  Well developed/well nourished in NAD Skin warm/dry Patient not depressed No peripheral clubbing Back-normal HEENT-normal/normal eyelids Neck supple/normal carotid upstroke bilaterally; no bruits; no JVD; no thyromegaly chest - CTA/ normal expansion CV - RRR/normal S1 and S2; no murmurs, rubs or gallops;  PMI nondisplaced Abdomen -NT/ND, no HSM, no mass, + bowel sounds, no bruit 2+ femoral pulses, no bruits Ext-no edema, chords, 2+ DP Neuro-grossly nonfocal  ECG - 06/14/23 NSR with no ST changes; personally reviewed  A/P  1 Chest pain-symptoms are very atypical.  Electrocardiogram shows no ST changes.  We discussed a coronary CTA today but she would prefer to be conservative for now and I think this is reasonable.  Can consider further evaluation in the future if her symptoms worsen.  She feels some of her symptoms may be anxiety  related.  2 Family history of coronary artery disease-Brother and sister with myocardial infarction.  Will schedule calcium  score.  If elevated we will be aggressive in treating hyperlipidemia.  3 hyperlipidemia-she is having myalgias with Lipitor that was recently initiated.  Will discontinue and instead treat with Crestor 10 mg daily.  If she does not tolerate will consider Zetia or PCSK9 inhibitor particularly if calcium  score is elevated.  Alexandria Angel, MD

## 2023-07-06 ENCOUNTER — Encounter: Payer: Self-pay | Admitting: Cardiology

## 2023-07-06 ENCOUNTER — Ambulatory Visit: Attending: Cardiology | Admitting: Cardiology

## 2023-07-06 ENCOUNTER — Other Ambulatory Visit (HOSPITAL_COMMUNITY): Payer: Self-pay

## 2023-07-06 VITALS — BP 120/86 | HR 73 | Ht 63.0 in | Wt 172.4 lb

## 2023-07-06 DIAGNOSIS — E782 Mixed hyperlipidemia: Secondary | ICD-10-CM

## 2023-07-06 DIAGNOSIS — Z8249 Family history of ischemic heart disease and other diseases of the circulatory system: Secondary | ICD-10-CM

## 2023-07-06 DIAGNOSIS — R072 Precordial pain: Secondary | ICD-10-CM | POA: Diagnosis not present

## 2023-07-06 MED ORDER — ROSUVASTATIN CALCIUM 10 MG PO TABS
10.0000 mg | ORAL_TABLET | Freq: Every day | ORAL | 3 refills | Status: AC
  Filled 2023-07-06: qty 90, 90d supply, fill #0
  Filled 2023-10-11: qty 90, 90d supply, fill #1
  Filled 2024-01-18: qty 90, 90d supply, fill #2

## 2023-07-06 NOTE — Patient Instructions (Signed)
 Medication Instructions:   STOP ATORVASTATIN   START ROSUVASTATIN 10 MG ONCE DAILY  *If you need a refill on your cardiac medications before your next appointment, please call your pharmacy*  Lab Work:  Your physician recommends that you return for lab work in: 8 [facility]  If you have labs (blood work) drawn today and your tests are completely normal, you will receive your results only by: MyChart Message (if you have MyChart) OR A paper copy in the mail If you have any lab test that is abnormal or we need to change your treatment, we will call you to review the results.  Testing/Procedures:  CORONARY CALCIUM  SCORING CT SCAN AT THE [facility]  Follow-Up: At [facility], you and your health needs are our priority.  As part of our continuing mission to provide you with exceptional heart care, our providers are all part of one team.  This team includes your primary Cardiologist (physician) and Advanced Practice Providers or APPs (Physician Assistants and Nurse Practitioners) who all work together to provide you with the care you need, when you need it.  Your next appointment:   12 month(s)  Provider:   Alexandria Angel MD

## 2023-07-23 ENCOUNTER — Other Ambulatory Visit: Payer: Self-pay | Admitting: Nurse Practitioner

## 2023-07-23 DIAGNOSIS — J302 Other seasonal allergic rhinitis: Secondary | ICD-10-CM

## 2023-07-25 ENCOUNTER — Other Ambulatory Visit (HOSPITAL_BASED_OUTPATIENT_CLINIC_OR_DEPARTMENT_OTHER)

## 2023-07-27 ENCOUNTER — Other Ambulatory Visit (HOSPITAL_COMMUNITY): Payer: Self-pay

## 2023-07-27 MED ORDER — NOREL AD 4-10-325 MG PO TABS
1.0000 | ORAL_TABLET | Freq: Two times a day (BID) | ORAL | 0 refills | Status: AC | PRN
  Filled 2023-07-27: qty 30, 15d supply, fill #0

## 2023-08-03 ENCOUNTER — Other Ambulatory Visit (HOSPITAL_COMMUNITY): Payer: Self-pay

## 2023-08-08 ENCOUNTER — Ambulatory Visit: Admitting: Orthopedic Surgery

## 2023-08-08 ENCOUNTER — Ambulatory Visit (HOSPITAL_BASED_OUTPATIENT_CLINIC_OR_DEPARTMENT_OTHER)
Admission: RE | Admit: 2023-08-08 | Discharge: 2023-08-08 | Disposition: A | Discharge status: Home / Self Care | Payer: Self-pay | Source: Ambulatory Visit | Attending: Cardiology | Admitting: Cardiology

## 2023-08-08 ENCOUNTER — Ambulatory Visit: Payer: Self-pay | Admitting: Cardiology

## 2023-08-08 DIAGNOSIS — Z8249 Family history of ischemic heart disease and other diseases of the circulatory system: Secondary | ICD-10-CM | POA: Insufficient documentation

## 2023-08-09 ENCOUNTER — Other Ambulatory Visit (HOSPITAL_COMMUNITY): Payer: Self-pay

## 2023-08-09 ENCOUNTER — Encounter: Payer: Self-pay | Admitting: Nurse Practitioner

## 2023-08-09 ENCOUNTER — Ambulatory Visit: Admitting: Nurse Practitioner

## 2023-08-09 VITALS — BP 110/60 | HR 82 | Temp 98.7°F | Ht 63.0 in | Wt 175.0 lb

## 2023-08-09 DIAGNOSIS — E559 Vitamin D deficiency, unspecified: Secondary | ICD-10-CM

## 2023-08-09 DIAGNOSIS — F419 Anxiety disorder, unspecified: Secondary | ICD-10-CM

## 2023-08-09 DIAGNOSIS — E782 Mixed hyperlipidemia: Secondary | ICD-10-CM

## 2023-08-09 DIAGNOSIS — Z6831 Body mass index (BMI) 31.0-31.9, adult: Secondary | ICD-10-CM

## 2023-08-09 DIAGNOSIS — E66811 Obesity, class 1: Secondary | ICD-10-CM | POA: Diagnosis not present

## 2023-08-09 DIAGNOSIS — E6609 Other obesity due to excess calories: Secondary | ICD-10-CM

## 2023-08-09 MED ORDER — VITAMIN D (ERGOCALCIFEROL) 1.25 MG (50000 UNIT) PO CAPS
50000.0000 [IU] | ORAL_CAPSULE | ORAL | 1 refills | Status: DC
  Filled 2023-08-09 – 2023-08-15 (×2): qty 12, 84d supply, fill #0
  Filled 2023-11-22: qty 12, 84d supply, fill #1

## 2023-08-09 NOTE — Assessment & Plan Note (Signed)
 She seen Dr. Pietro yesterday and was switched from lipitor to rosuvastatin . She also had a normal calcium  risk score scan done. She is to get labs in 8 weeks with his office

## 2023-08-09 NOTE — Assessment & Plan Note (Signed)
 She is doing much better with the buspirone . She has not had to use the vistaril . Continue current medications

## 2023-08-09 NOTE — Progress Notes (Signed)
 I,Jameka J Llittleton, CMA,acting as a Neurosurgeon for [employer], FNP.,have documented all relevant documentation on the behalf of Cynthia Ada, FNP,as directed by  Cynthia Ada, FNP while in the presence of Cynthia Ada, FNP.  Subjective:  Patient ID: Cynthia Barron , female    DOB: Nov 21, 1965 , 58 y.o.   MRN: [id number]  Chief Complaint  Patient presents with   Anxiety    Patient presents today for anxiety f/u. Patient reports she doing much better with the buspar . She doesn't have any questions or concerns at this time.     HPI  She is doing well with the buspirone .  She has not had to use the vistaril  since starting  She seen Dr Pietro and had a CTA  Anxiety Presents for follow-up visit. Patient reports no chest pain, depressed mood, hyperventilation, insomnia, irritability, nervous/anxious behavior, palpitations, panic or shortness of breath. Symptoms occur most days. The severity of symptoms is mild. The patient sleeps 8 hours per night. The quality of sleep is fair. Nighttime awakenings: one to two.   Compliance with medications is 76-100%.     Past Medical History:  Diagnosis Date   Hyperlipidemia    Seasonal allergies    Tachycardia      Family History  Problem Relation Age of Onset   Parkinson's disease Mother    Diabetes Mother    Hypertension Mother    Hyperlipidemia Mother    Hypertension Father    Hyperlipidemia Father    Heart attack Sister    Heart attack Brother      Current Outpatient Medications:    busPIRone  (BUSPAR ) 5 MG tablet, Take 1 tablet (5 mg total) by mouth daily., Disp: 30 tablet, Rfl: 2   fluticasone  (FLONASE ) 50 MCG/ACT nasal spray, Place 2 sprays into both nostrils daily., Disp: 16 g, Rfl: 2   hydrOXYzine  (VISTARIL ) 25 MG capsule, Take 1 capsule (25 mg total) by mouth 3 (three) times daily as needed., Disp: 30 capsule, Rfl: 5   levocetirizine (XYZAL ) 5 MG tablet, Take 1 tablet (5 mg total) by mouth every evening., Disp: 90 tablet, Rfl:  1   magnesium  oxide (MAG-OX) 400 MG tablet, Take 1 tablet (400 mg total) by mouth daily., Disp: 90 tablet, Rfl: 2   meclizine  (ANTIVERT ) 25 MG tablet, Take 1 tablet by mouth 3 times a day, Disp: 60 tablet, Rfl: 1   meloxicam  (MOBIC ) 15 MG tablet, Take 1 tablet (15 mg total) by mouth daily., Disp: 30 tablet, Rfl: 0   metoprolol  succinate (TOPROL -XL) 25 MG 24 hr tablet, Take 1 tablet (25 mg total) by mouth daily., Disp: 90 tablet, Rfl: 2   NOREL AD 4-10-325 MG TABS, Take 1 tablet by mouth 2 (two) times daily as needed., Disp: 30 tablet, Rfl: 0   nystatin  powder, Apply 1 application topically 3 (three) times daily., Disp: 15 g, Rfl: 0   PARoxetine  (PAXIL ) 20 MG tablet, Take 1 tablet (20 mg total) by mouth daily., Disp: 90 tablet, Rfl: 1   progesterone  (PROMETRIUM ) 100 MG capsule, Take 1 capsule (100 mg total) by mouth at bedtime., Disp: 90 capsule, Rfl: 3   rosuvastatin  (CRESTOR ) 10 MG tablet, Take 1 tablet (10 mg total) by mouth daily., Disp: 90 tablet, Rfl: 3   SUMAtriptan  (IMITREX ) 50 MG tablet, Take 1 tablet (50 mg total) by mouth as directed at onset of migraine, may repeat in 2hours if needed., Disp: 10 tablet, Rfl: 5   traZODone  (DESYREL ) 50 MG tablet, Take 1 tablet (50 mg  total) by mouth at bedtime., Disp: 30 tablet, Rfl: 2   Vitamin D , Ergocalciferol , (DRISDOL ) 1.25 MG (50000 UNIT) CAPS capsule, Take 1 capsule (50,000 Units total) by mouth every 7 (seven) days., Disp: 12 capsule, Rfl: 1   Allergies  Allergen Reactions   Dicyclomine Hcl     REACTION: Dizziness   Penicillins     REACTION: vomiting     Review of Systems  Constitutional:  Negative for irritability.  Respiratory:  Negative for shortness of breath.   Cardiovascular:  Negative for chest pain and palpitations.  Psychiatric/Behavioral:  The patient is not nervous/anxious and does not have insomnia.      Today's Vitals   08/09/23 1144  BP: 110/60  Pulse: 82  Temp: 98.7 F (37.1 C)  TempSrc: Oral  Weight: 175 lb (79.4  kg)  Height: 5' 3 (1.6 m)  PainSc: 0-No pain   Body mass index is 31 kg/m.  Wt Readings from Last 3 Encounters:  08/09/23 175 lb (79.4 kg)  07/06/23 172 lb 6.4 oz (78.2 kg)  06/14/23 173 lb (78.5 kg)      Objective:  Physical Exam Vitals and nursing note reviewed.  Constitutional:      General: She is not in acute distress.    Appearance: Normal appearance. She is obese.  HENT:     Head: Normocephalic and atraumatic.   Cardiovascular:     Rate and Rhythm: Normal rate and regular rhythm.     Pulses:          Radial pulses are 2+ on the right side and 2+ on the left side.     Heart sounds: Normal heart sounds. Heart sounds not distant. No murmur heard. Pulmonary:     Effort: Pulmonary effort is normal. No tachypnea or respiratory distress.     Breath sounds: Normal breath sounds.  Chest:     Chest wall: No tenderness.   Skin:    General: Skin is warm and dry.     Capillary Refill: Capillary refill takes less than 2 seconds.   Neurological:     General: No focal deficit present.     Mental Status: She is alert.   Psychiatric:        Mood and Affect: Mood normal.        Behavior: Behavior normal.        Thought Content: Thought content normal.        Judgment: Judgment normal.         Assessment And Plan:  Anxiety Assessment & Plan: She is doing much better with the buspirone . She has not had to use the vistaril . Continue current medications   Vitamin D  deficiency Assessment & Plan: Continue vitamin d  supplementation Also encouraged to spend 15 minutes in the sun daily.    Orders: -     Vitamin D  (Ergocalciferol ); Take 1 capsule (50,000 Units total) by mouth every 7 (seven) days.  Dispense: 12 capsule; Refill: 1  Class 1 obesity due to excess calories without serious comorbidity with body mass index (BMI) of 31.0 to 31.9 in adult  Mixed hyperlipidemia Assessment & Plan: She seen Dr. Pietro yesterday and was switched from lipitor to rosuvastatin . She  also had a normal calcium  risk score scan done. She is to get labs in 8 weeks with his office     Return if symptoms worsen or fail to improve.   Patient was given opportunity to ask questions. Patient verbalized understanding of the plan and was able to repeat  key elements of the plan. All questions were answered to their satisfaction.    LILLETTE Cynthia Ada, FNP, have reviewed all documentation for this visit. The documentation on 08/09/23 for the exam, diagnosis, procedures, and orders are all accurate and complete.   IF YOU HAVE BEEN REFERRED TO A SPECIALIST, IT MAY TAKE 1-2 WEEKS TO SCHEDULE/PROCESS THE REFERRAL. IF YOU HAVE NOT HEARD FROM US /SPECIALIST IN TWO WEEKS, PLEASE GIVE US  A CALL AT [phone].

## 2023-08-09 NOTE — Assessment & Plan Note (Signed)
 Continue vitamin d  supplementation Also encouraged to spend 15 minutes in the sun daily.

## 2023-08-10 ENCOUNTER — Other Ambulatory Visit: Payer: Self-pay | Admitting: Nurse Practitioner

## 2023-08-10 ENCOUNTER — Other Ambulatory Visit: Payer: Self-pay

## 2023-08-10 DIAGNOSIS — F5101 Primary insomnia: Secondary | ICD-10-CM

## 2023-08-12 MED ORDER — TRAZODONE HCL 50 MG PO TABS
50.0000 mg | ORAL_TABLET | Freq: Every day | ORAL | 2 refills | Status: AC
  Filled 2023-08-12: qty 30, 30d supply, fill #0
  Filled 2023-10-15: qty 30, 30d supply, fill #1

## 2023-08-14 ENCOUNTER — Other Ambulatory Visit (HOSPITAL_COMMUNITY): Payer: Self-pay

## 2023-08-15 ENCOUNTER — Other Ambulatory Visit (HOSPITAL_COMMUNITY): Payer: Self-pay

## 2023-08-31 ENCOUNTER — Other Ambulatory Visit: Payer: Self-pay | Admitting: Nurse Practitioner

## 2023-08-31 ENCOUNTER — Other Ambulatory Visit (HOSPITAL_COMMUNITY): Payer: Self-pay

## 2023-08-31 DIAGNOSIS — R000 Tachycardia, unspecified: Secondary | ICD-10-CM

## 2023-08-31 MED ORDER — METOPROLOL SUCCINATE ER 25 MG PO TB24
25.0000 mg | ORAL_TABLET | Freq: Every day | ORAL | 2 refills | Status: DC
  Filled 2023-08-31: qty 90, 90d supply, fill #0

## 2023-09-11 ENCOUNTER — Other Ambulatory Visit: Payer: Self-pay | Admitting: Nurse Practitioner

## 2023-09-11 DIAGNOSIS — F419 Anxiety disorder, unspecified: Secondary | ICD-10-CM

## 2023-09-12 ENCOUNTER — Other Ambulatory Visit (HOSPITAL_COMMUNITY): Payer: Self-pay

## 2023-09-12 MED ORDER — BUSPIRONE HCL 5 MG PO TABS
5.0000 mg | ORAL_TABLET | Freq: Every day | ORAL | 2 refills | Status: DC
  Filled 2023-09-12: qty 30, 30d supply, fill #0
  Filled 2023-10-15: qty 30, 30d supply, fill #1
  Filled 2023-11-21 (×2): qty 30, 30d supply, fill #2

## 2023-09-18 ENCOUNTER — Other Ambulatory Visit (HOSPITAL_COMMUNITY): Payer: Self-pay

## 2023-09-18 DIAGNOSIS — E782 Mixed hyperlipidemia: Secondary | ICD-10-CM | POA: Diagnosis not present

## 2023-09-19 LAB — LIPID PANEL
Chol/HDL Ratio: 2.7 ratio (ref 0.0–4.4)
Cholesterol, Total: 137 mg/dL (ref 100–199)
HDL: 50 mg/dL (ref >39)
LDL Chol Calc (NIH): 70 mg/dL (ref 0–99)
Triglycerides: 91 mg/dL (ref 0–149)
VLDL Cholesterol Cal: 17 mg/dL (ref 5–40)

## 2023-09-19 LAB — HEPATIC FUNCTION PANEL
ALT: 22 IU/L (ref 0–32)
AST: 26 IU/L (ref 0–40)
Albumin: 4.4 g/dL (ref 3.8–4.9)
Alkaline Phosphatase: 85 IU/L (ref 44–121)
Bilirubin Total: 0.4 mg/dL (ref 0.0–1.2)
Bilirubin, Direct: 0.15 mg/dL (ref 0.00–0.40)
Total Protein: 7 g/dL (ref 6.0–8.5)

## 2023-10-16 ENCOUNTER — Other Ambulatory Visit (HOSPITAL_COMMUNITY): Payer: Self-pay

## 2023-10-17 ENCOUNTER — Other Ambulatory Visit: Payer: Self-pay

## 2023-10-17 ENCOUNTER — Other Ambulatory Visit (HOSPITAL_COMMUNITY): Payer: Self-pay

## 2023-11-06 ENCOUNTER — Ambulatory Visit: Payer: Self-pay | Admitting: Nurse Practitioner

## 2023-11-16 ENCOUNTER — Ambulatory Visit: Payer: Self-pay | Admitting: Nurse Practitioner

## 2023-11-21 ENCOUNTER — Other Ambulatory Visit (HOSPITAL_COMMUNITY): Payer: Self-pay

## 2023-11-21 ENCOUNTER — Other Ambulatory Visit: Payer: Self-pay | Admitting: Nurse Practitioner

## 2023-11-21 ENCOUNTER — Other Ambulatory Visit: Payer: Self-pay

## 2023-11-21 DIAGNOSIS — F419 Anxiety disorder, unspecified: Secondary | ICD-10-CM

## 2023-11-21 DIAGNOSIS — Z780 Asymptomatic menopausal state: Secondary | ICD-10-CM

## 2023-11-21 MED ORDER — PAROXETINE HCL 20 MG PO TABS
20.0000 mg | ORAL_TABLET | Freq: Every day | ORAL | 1 refills | Status: AC
  Filled 2023-11-21: qty 30, 30d supply, fill #0
  Filled 2023-11-21: qty 90, 90d supply, fill #0
  Filled 2023-12-25: qty 30, 30d supply, fill #1
  Filled 2024-02-12: qty 30, 30d supply, fill #2
  Filled 2024-03-19: qty 30, 30d supply, fill #3

## 2023-11-23 ENCOUNTER — Other Ambulatory Visit (HOSPITAL_COMMUNITY): Payer: Self-pay

## 2023-11-28 ENCOUNTER — Other Ambulatory Visit (HOSPITAL_COMMUNITY): Payer: Self-pay

## 2023-11-28 ENCOUNTER — Ambulatory Visit (INDEPENDENT_AMBULATORY_CARE_PROVIDER_SITE_OTHER): Payer: Self-pay | Admitting: Nurse Practitioner

## 2023-11-28 ENCOUNTER — Encounter: Payer: Self-pay | Admitting: Nurse Practitioner

## 2023-11-28 VITALS — BP 120/80 | HR 73 | Temp 98.6°F | Ht 63.0 in | Wt 177.0 lb

## 2023-11-28 DIAGNOSIS — R7309 Other abnormal glucose: Secondary | ICD-10-CM | POA: Diagnosis not present

## 2023-11-28 DIAGNOSIS — E6609 Other obesity due to excess calories: Secondary | ICD-10-CM

## 2023-11-28 DIAGNOSIS — F419 Anxiety disorder, unspecified: Secondary | ICD-10-CM | POA: Diagnosis not present

## 2023-11-28 DIAGNOSIS — E782 Mixed hyperlipidemia: Secondary | ICD-10-CM

## 2023-11-28 DIAGNOSIS — Z139 Encounter for screening, unspecified: Secondary | ICD-10-CM

## 2023-11-28 DIAGNOSIS — R000 Tachycardia, unspecified: Secondary | ICD-10-CM | POA: Diagnosis not present

## 2023-11-28 DIAGNOSIS — Z6831 Body mass index (BMI) 31.0-31.9, adult: Secondary | ICD-10-CM

## 2023-11-28 DIAGNOSIS — Z2821 Immunization not carried out because of patient refusal: Secondary | ICD-10-CM

## 2023-11-28 DIAGNOSIS — E66811 Obesity, class 1: Secondary | ICD-10-CM

## 2023-11-28 MED ORDER — METOPROLOL SUCCINATE ER 25 MG PO TB24
25.0000 mg | ORAL_TABLET | Freq: Every day | ORAL | 2 refills | Status: AC
  Filled 2023-11-28: qty 30, 30d supply, fill #0
  Filled 2024-01-18: qty 30, 30d supply, fill #1
  Filled 2024-03-04: qty 30, 30d supply, fill #2

## 2023-11-28 MED ORDER — BUSPIRONE HCL 5 MG PO TABS
5.0000 mg | ORAL_TABLET | Freq: Every day | ORAL | 1 refills | Status: AC
  Filled 2023-11-28 – 2023-12-25 (×2): qty 90, 90d supply, fill #0

## 2023-11-28 MED ORDER — HYDROXYZINE PAMOATE 25 MG PO CAPS
25.0000 mg | ORAL_CAPSULE | Freq: Three times a day (TID) | ORAL | 5 refills | Status: AC | PRN
  Filled 2023-11-28: qty 30, 10d supply, fill #0

## 2023-11-28 NOTE — Progress Notes (Signed)
 LILLETTE Kristeen JINNY Gladis, CMA,acting as a neurosurgeon for [name], FNP.,have documented all relevant documentation on the behalf of [name], FNP,as directed by  [name], FNP while in the presence of [name], FNP.  Subjective:  Patient ID: Cynthia Barron , female    DOB: June 10, 1965 , 58 y.o.   MRN: [id number]  Chief Complaint  Patient presents with   Hyperlipidemia    Patient presents today for a chol follow up, Patient reports compliance with medication. Patient denies any chest pain, SOB, or headaches. Patient has no concerns today.      HPI  Overall she is doing well. No concerns at this time.    Past Medical History:  Diagnosis Date   Allergy 04/27/1998   Unsure of date   Anxiety 04/27/2011   Unsure of start date   Arthritis 06/04/2018   Unsure of start date   Hyperlipidemia    Seasonal allergies    Tachycardia      Family History  Problem Relation Age of Onset   Parkinson's disease Mother    Diabetes Mother    Hypertension Mother    Hyperlipidemia Mother    Hypertension Father    Hyperlipidemia Father    Heart attack Sister    Heart attack Brother    Diabetes Sister    Heart attack Sister    Heart attack Brother      Current Outpatient Medications:    fluticasone  (FLONASE ) 50 MCG/ACT nasal spray, Place 2 sprays into both nostrils daily., Disp: 16 g, Rfl: 2   levocetirizine (XYZAL ) 5 MG tablet, Take 1 tablet (5 mg total) by mouth every evening., Disp: 90 tablet, Rfl: 1   magnesium  oxide (MAG-OX) 400 MG tablet, Take 1 tablet (400 mg total) by mouth daily., Disp: 90 tablet, Rfl: 2   meclizine  (ANTIVERT ) 25 MG tablet, Take 1 tablet by mouth 3 times a day, Disp: 60 tablet, Rfl: 1   meloxicam  (MOBIC ) 15 MG tablet, Take 1 tablet (15 mg total) by mouth daily., Disp: 30 tablet, Rfl: 0   NOREL AD 4-10-325 MG TABS, Take 1 tablet by mouth 2 (two) times daily as needed., Disp: 30 tablet, Rfl: 0   nystatin  powder, Apply 1 application topically 3 (three) times daily.,  Disp: 15 g, Rfl: 0   PARoxetine  (PAXIL ) 20 MG tablet, Take 1 tablet (20 mg total) by mouth daily., Disp: 90 tablet, Rfl: 1   progesterone  (PROMETRIUM ) 100 MG capsule, Take 1 capsule (100 mg total) by mouth at bedtime., Disp: 90 capsule, Rfl: 3   rosuvastatin  (CRESTOR ) 10 MG tablet, Take 1 tablet (10 mg total) by mouth daily., Disp: 90 tablet, Rfl: 3   SUMAtriptan  (IMITREX ) 50 MG tablet, Take 1 tablet (50 mg total) by mouth as directed at onset of migraine, may repeat in 2hours if needed., Disp: 10 tablet, Rfl: 5   traZODone  (DESYREL ) 50 MG tablet, Take 1 tablet (50 mg total) by mouth at bedtime., Disp: 30 tablet, Rfl: 2   Vitamin D , Ergocalciferol , (DRISDOL ) 1.25 MG (50000 UNIT) CAPS capsule, Take 1 capsule (50,000 Units total) by mouth every 7 (seven) days., Disp: 12 capsule, Rfl: 1   busPIRone  (BUSPAR ) 5 MG tablet, Take 1 tablet (5 mg total) by mouth daily., Disp: 90 tablet, Rfl: 1   hydrOXYzine  (VISTARIL ) 25 MG capsule, Take 1 capsule (25 mg total) by mouth 3 (three) times daily as needed., Disp: 30 capsule, Rfl: 5   metoprolol  succinate (TOPROL -XL) 25 MG 24 hr tablet, Take 1 tablet (25  mg total) by mouth daily., Disp: 90 tablet, Rfl: 2   Allergies  Allergen Reactions   Dicyclomine Hcl     REACTION: Dizziness   Penicillins     REACTION: vomiting     Review of Systems  Constitutional: Negative.   Respiratory: Negative.    Cardiovascular: Negative.   Musculoskeletal: Negative.   Skin: Negative.   Neurological: Negative.   Psychiatric/Behavioral: Negative.       Today's Vitals   11/28/23 0917  BP: 120/80  Pulse: 73  Temp: 98.6 F (37 C)  TempSrc: Oral  Weight: 177 lb (80.3 kg)  Height: 5' 3 (1.6 m)  PainSc: 0-No pain   Body mass index is 31.35 kg/m.  Wt Readings from Last 3 Encounters:  11/28/23 177 lb (80.3 kg)  08/09/23 175 lb (79.4 kg)  07/06/23 172 lb 6.4 oz (78.2 kg)    The 10-year ASCVD risk score (Arnett DK, et al., 2019) is: 3.2%   Values used to calculate the  score:     Age: 70 years     Clincally relevant sex: Female     Is Non-Hispanic African American: Yes     Diabetic: No     Tobacco smoker: No     Systolic Blood Pressure: 120 mmHg     Is BP treated: No     HDL Cholesterol: 50 mg/dL     Total Cholesterol: 137 mg/dL  Objective:  Physical Exam Vitals and nursing note reviewed.  Constitutional:      General: She is not in acute distress.    Appearance: Normal appearance. She is obese.  HENT:     Head: Normocephalic and atraumatic.  Cardiovascular:     Rate and Rhythm: Normal rate and regular rhythm.     Pulses: Normal pulses.          Radial pulses are 2+ on the right side and 2+ on the left side.     Heart sounds: Normal heart sounds. Heart sounds not distant. No murmur heard. Pulmonary:     Effort: Pulmonary effort is normal. No tachypnea or respiratory distress.     Breath sounds: Normal breath sounds.  Chest:     Chest wall: No tenderness.  Skin:    General: Skin is warm and dry.     Capillary Refill: Capillary refill takes less than 2 seconds.  Neurological:     General: No focal deficit present.     Mental Status: She is alert.  Psychiatric:        Mood and Affect: Mood normal.        Behavior: Behavior normal.        Thought Content: Thought content normal.        Judgment: Judgment normal.        Assessment And Plan:  Mixed hyperlipidemia Assessment & Plan: She is doing well with her cholesterol medications.    Influenza vaccination declined  Herpes zoster vaccination declined  Class 1 obesity due to excess calories without serious comorbidity with body mass index (BMI) of 31.0 to 31.9 in adult Assessment & Plan: She is encouraged to strive for BMI less than 30 to decrease cardiac risk. Advised to aim for at least 150 minutes of exercise per week.    Encounter for screening -     Hepatitis B surface antibody,qualitative  Tachycardia Assessment & Plan: Continue metoprolol   Orders: -     Metoprolol   Succinate ER; Take 1 tablet (25 mg total) by mouth daily.  Dispense: 90  tablet; Refill: 2  Anxiety Assessment & Plan: She is doing well. Continue current medications.   Orders: -     busPIRone  HCl; Take 1 tablet (5 mg total) by mouth daily.  Dispense: 90 tablet; Refill: 1  Abnormal glucose Assessment & Plan: HgbA1c is stable, encouraged to focus on healthy diet and regular exercise  Orders: -     Hemoglobin A1c  Other orders -     hydrOXYzine  Pamoate; Take 1 capsule (25 mg total) by mouth 3 (three) times daily as needed.  Dispense: 30 capsule; Refill: 5    Return for keep same next.  Patient was given opportunity to ask questions. Patient verbalized understanding of the plan and was able to repeat key elements of the plan. All questions were answered to their satisfaction.    LILLETTE [name], FNP, have reviewed all documentation for this visit. The documentation on 11/28/23 for the exam, diagnosis, procedures, and orders are all accurate and complete.   IF YOU HAVE BEEN REFERRED TO A SPECIALIST, IT MAY TAKE 1-2 WEEKS TO SCHEDULE/PROCESS THE REFERRAL. IF YOU HAVE NOT HEARD FROM US /SPECIALIST IN TWO WEEKS, PLEASE GIVE US  A CALL AT [phone] X [phone].

## 2023-12-10 NOTE — Assessment & Plan Note (Signed)
 She is doing well. Continue current medications.

## 2023-12-10 NOTE — Assessment & Plan Note (Signed)
 Continue metoprolol.

## 2023-12-10 NOTE — Assessment & Plan Note (Signed)
HgbA1c is stable, encouraged to focus on healthy diet and regular exercise

## 2023-12-10 NOTE — Assessment & Plan Note (Signed)
 She is doing well with her cholesterol medications.

## 2023-12-10 NOTE — Assessment & Plan Note (Signed)
 She is encouraged to strive for BMI less than 30 to decrease cardiac risk. Advised to aim for at least 150 minutes of exercise per week.

## 2023-12-18 ENCOUNTER — Encounter: Payer: Self-pay | Admitting: Radiology

## 2023-12-25 ENCOUNTER — Other Ambulatory Visit (HOSPITAL_COMMUNITY): Payer: Self-pay

## 2024-01-19 ENCOUNTER — Other Ambulatory Visit (HOSPITAL_COMMUNITY): Payer: Self-pay

## 2024-01-24 ENCOUNTER — Other Ambulatory Visit (HOSPITAL_COMMUNITY): Payer: Self-pay

## 2024-02-12 ENCOUNTER — Other Ambulatory Visit (HOSPITAL_COMMUNITY): Payer: Self-pay

## 2024-03-19 ENCOUNTER — Other Ambulatory Visit: Payer: Self-pay | Admitting: Nurse Practitioner

## 2024-03-19 DIAGNOSIS — E559 Vitamin D deficiency, unspecified: Secondary | ICD-10-CM

## 2024-03-20 ENCOUNTER — Other Ambulatory Visit (HOSPITAL_COMMUNITY): Payer: Self-pay

## 2024-03-20 ENCOUNTER — Other Ambulatory Visit: Payer: Self-pay

## 2024-03-20 MED ORDER — VITAMIN D (ERGOCALCIFEROL) 1.25 MG (50000 UNIT) PO CAPS
50000.0000 [IU] | ORAL_CAPSULE | ORAL | 1 refills | Status: AC
  Filled 2024-03-20: qty 4, 28d supply, fill #0

## 2024-05-06 ENCOUNTER — Encounter: Payer: Self-pay | Admitting: Nurse Practitioner
